# Patient Record
Sex: Female | Born: 1935 | Race: White | Hispanic: No | Smoking: Never smoker
Health system: Southern US, Community
[De-identification: ages and names within clinical notes are randomized; demographics above are authoritative.]

## PROBLEM LIST (undated history)

## (undated) DIAGNOSIS — I10 Essential (primary) hypertension: Secondary | ICD-10-CM

---

## 1963-05-02 HISTORY — PX: ABDOMINAL HYSTERECTOMY: SUR658

## 1993-05-01 HISTORY — PX: COLECTOMY: SHX59

## 2011-05-05 DIAGNOSIS — Z87891 Personal history of nicotine dependence: Secondary | ICD-10-CM | POA: Diagnosis not present

## 2011-05-05 DIAGNOSIS — J441 Chronic obstructive pulmonary disease with (acute) exacerbation: Secondary | ICD-10-CM | POA: Diagnosis not present

## 2011-05-05 DIAGNOSIS — I10 Essential (primary) hypertension: Secondary | ICD-10-CM | POA: Diagnosis not present

## 2011-05-05 DIAGNOSIS — R0602 Shortness of breath: Secondary | ICD-10-CM | POA: Diagnosis not present

## 2011-05-09 DIAGNOSIS — I4949 Other premature depolarization: Secondary | ICD-10-CM | POA: Diagnosis not present

## 2011-05-09 DIAGNOSIS — I10 Essential (primary) hypertension: Secondary | ICD-10-CM | POA: Diagnosis not present

## 2011-05-29 DIAGNOSIS — H40059 Ocular hypertension, unspecified eye: Secondary | ICD-10-CM | POA: Diagnosis not present

## 2011-06-21 DIAGNOSIS — I6789 Other cerebrovascular disease: Secondary | ICD-10-CM | POA: Diagnosis not present

## 2011-06-21 DIAGNOSIS — I4949 Other premature depolarization: Secondary | ICD-10-CM | POA: Diagnosis not present

## 2011-06-21 DIAGNOSIS — I4510 Unspecified right bundle-branch block: Secondary | ICD-10-CM | POA: Diagnosis not present

## 2011-06-21 DIAGNOSIS — J449 Chronic obstructive pulmonary disease, unspecified: Secondary | ICD-10-CM | POA: Diagnosis not present

## 2011-06-21 DIAGNOSIS — I5030 Unspecified diastolic (congestive) heart failure: Secondary | ICD-10-CM | POA: Diagnosis not present

## 2011-06-21 DIAGNOSIS — H409 Unspecified glaucoma: Secondary | ICD-10-CM | POA: Diagnosis not present

## 2011-06-21 DIAGNOSIS — G459 Transient cerebral ischemic attack, unspecified: Secondary | ICD-10-CM | POA: Diagnosis not present

## 2011-06-21 DIAGNOSIS — H538 Other visual disturbances: Secondary | ICD-10-CM | POA: Diagnosis not present

## 2011-06-21 DIAGNOSIS — R002 Palpitations: Secondary | ICD-10-CM | POA: Diagnosis not present

## 2011-06-21 DIAGNOSIS — Z79899 Other long term (current) drug therapy: Secondary | ICD-10-CM | POA: Diagnosis not present

## 2011-06-21 DIAGNOSIS — I509 Heart failure, unspecified: Secondary | ICD-10-CM | POA: Diagnosis not present

## 2011-06-21 DIAGNOSIS — I6529 Occlusion and stenosis of unspecified carotid artery: Secondary | ICD-10-CM | POA: Diagnosis not present

## 2011-06-21 DIAGNOSIS — Z8673 Personal history of transient ischemic attack (TIA), and cerebral infarction without residual deficits: Secondary | ICD-10-CM | POA: Diagnosis not present

## 2011-06-21 DIAGNOSIS — I2510 Atherosclerotic heart disease of native coronary artery without angina pectoris: Secondary | ICD-10-CM | POA: Diagnosis not present

## 2011-06-21 DIAGNOSIS — I10 Essential (primary) hypertension: Secondary | ICD-10-CM | POA: Diagnosis not present

## 2011-06-22 DIAGNOSIS — I059 Rheumatic mitral valve disease, unspecified: Secondary | ICD-10-CM | POA: Diagnosis not present

## 2011-06-22 DIAGNOSIS — I517 Cardiomegaly: Secondary | ICD-10-CM | POA: Diagnosis not present

## 2011-06-22 DIAGNOSIS — I6789 Other cerebrovascular disease: Secondary | ICD-10-CM | POA: Diagnosis not present

## 2011-06-22 DIAGNOSIS — R002 Palpitations: Secondary | ICD-10-CM | POA: Diagnosis not present

## 2011-06-22 DIAGNOSIS — J449 Chronic obstructive pulmonary disease, unspecified: Secondary | ICD-10-CM | POA: Diagnosis not present

## 2011-06-22 DIAGNOSIS — I6529 Occlusion and stenosis of unspecified carotid artery: Secondary | ICD-10-CM | POA: Diagnosis not present

## 2011-07-03 DIAGNOSIS — R002 Palpitations: Secondary | ICD-10-CM | POA: Diagnosis not present

## 2011-07-10 DIAGNOSIS — R112 Nausea with vomiting, unspecified: Secondary | ICD-10-CM | POA: Diagnosis not present

## 2011-07-10 DIAGNOSIS — K920 Hematemesis: Secondary | ICD-10-CM | POA: Diagnosis not present

## 2011-07-10 DIAGNOSIS — R109 Unspecified abdominal pain: Secondary | ICD-10-CM | POA: Diagnosis not present

## 2011-07-10 DIAGNOSIS — R197 Diarrhea, unspecified: Secondary | ICD-10-CM | POA: Diagnosis not present

## 2011-07-12 DIAGNOSIS — R002 Palpitations: Secondary | ICD-10-CM | POA: Diagnosis not present

## 2011-07-12 DIAGNOSIS — I10 Essential (primary) hypertension: Secondary | ICD-10-CM | POA: Diagnosis not present

## 2011-07-14 DIAGNOSIS — K5289 Other specified noninfective gastroenteritis and colitis: Secondary | ICD-10-CM | POA: Diagnosis not present

## 2011-07-27 DIAGNOSIS — I10 Essential (primary) hypertension: Secondary | ICD-10-CM | POA: Diagnosis not present

## 2011-08-08 DIAGNOSIS — R002 Palpitations: Secondary | ICD-10-CM | POA: Diagnosis not present

## 2011-08-08 DIAGNOSIS — I10 Essential (primary) hypertension: Secondary | ICD-10-CM | POA: Diagnosis not present

## 2011-08-14 DIAGNOSIS — R197 Diarrhea, unspecified: Secondary | ICD-10-CM | POA: Diagnosis not present

## 2011-08-14 DIAGNOSIS — E860 Dehydration: Secondary | ICD-10-CM | POA: Diagnosis not present

## 2011-08-14 DIAGNOSIS — R112 Nausea with vomiting, unspecified: Secondary | ICD-10-CM | POA: Diagnosis not present

## 2011-10-16 DIAGNOSIS — I10 Essential (primary) hypertension: Secondary | ICD-10-CM | POA: Diagnosis not present

## 2011-10-16 DIAGNOSIS — E785 Hyperlipidemia, unspecified: Secondary | ICD-10-CM | POA: Diagnosis not present

## 2011-10-16 DIAGNOSIS — J449 Chronic obstructive pulmonary disease, unspecified: Secondary | ICD-10-CM | POA: Diagnosis not present

## 2011-10-16 DIAGNOSIS — J4489 Other specified chronic obstructive pulmonary disease: Secondary | ICD-10-CM | POA: Diagnosis not present

## 2011-10-16 DIAGNOSIS — D649 Anemia, unspecified: Secondary | ICD-10-CM | POA: Diagnosis not present

## 2011-10-16 DIAGNOSIS — I739 Peripheral vascular disease, unspecified: Secondary | ICD-10-CM | POA: Diagnosis not present

## 2011-10-23 DIAGNOSIS — H40059 Ocular hypertension, unspecified eye: Secondary | ICD-10-CM | POA: Diagnosis not present

## 2011-10-25 DIAGNOSIS — Z79899 Other long term (current) drug therapy: Secondary | ICD-10-CM | POA: Diagnosis not present

## 2011-10-25 DIAGNOSIS — R32 Unspecified urinary incontinence: Secondary | ICD-10-CM | POA: Diagnosis not present

## 2011-10-25 DIAGNOSIS — I248 Other forms of acute ischemic heart disease: Secondary | ICD-10-CM | POA: Diagnosis not present

## 2011-10-25 DIAGNOSIS — R0789 Other chest pain: Secondary | ICD-10-CM | POA: Diagnosis not present

## 2011-10-25 DIAGNOSIS — Z9889 Other specified postprocedural states: Secondary | ICD-10-CM | POA: Diagnosis not present

## 2011-10-25 DIAGNOSIS — I10 Essential (primary) hypertension: Secondary | ICD-10-CM | POA: Diagnosis not present

## 2011-10-25 DIAGNOSIS — Z91040 Latex allergy status: Secondary | ICD-10-CM | POA: Diagnosis not present

## 2011-10-25 DIAGNOSIS — I200 Unstable angina: Secondary | ICD-10-CM | POA: Diagnosis not present

## 2011-10-25 DIAGNOSIS — R079 Chest pain, unspecified: Secondary | ICD-10-CM | POA: Diagnosis not present

## 2011-10-25 DIAGNOSIS — Z7982 Long term (current) use of aspirin: Secondary | ICD-10-CM | POA: Diagnosis not present

## 2011-10-25 DIAGNOSIS — E860 Dehydration: Secondary | ICD-10-CM | POA: Diagnosis not present

## 2011-10-25 DIAGNOSIS — R072 Precordial pain: Secondary | ICD-10-CM | POA: Diagnosis not present

## 2011-10-25 DIAGNOSIS — J4489 Other specified chronic obstructive pulmonary disease: Secondary | ICD-10-CM | POA: Diagnosis present

## 2011-10-25 DIAGNOSIS — Z91041 Radiographic dye allergy status: Secondary | ICD-10-CM | POA: Diagnosis not present

## 2011-10-25 DIAGNOSIS — Z833 Family history of diabetes mellitus: Secondary | ICD-10-CM | POA: Diagnosis not present

## 2011-10-25 DIAGNOSIS — I209 Angina pectoris, unspecified: Secondary | ICD-10-CM | POA: Diagnosis not present

## 2011-10-25 DIAGNOSIS — R5381 Other malaise: Secondary | ICD-10-CM | POA: Diagnosis not present

## 2011-10-25 DIAGNOSIS — J449 Chronic obstructive pulmonary disease, unspecified: Secondary | ICD-10-CM | POA: Diagnosis not present

## 2011-10-25 DIAGNOSIS — R0602 Shortness of breath: Secondary | ICD-10-CM | POA: Diagnosis not present

## 2011-10-25 DIAGNOSIS — R110 Nausea: Secondary | ICD-10-CM | POA: Diagnosis not present

## 2011-10-25 DIAGNOSIS — I5032 Chronic diastolic (congestive) heart failure: Secondary | ICD-10-CM | POA: Diagnosis not present

## 2011-10-25 DIAGNOSIS — R002 Palpitations: Secondary | ICD-10-CM | POA: Diagnosis present

## 2011-10-25 DIAGNOSIS — H409 Unspecified glaucoma: Secondary | ICD-10-CM | POA: Diagnosis not present

## 2011-10-25 DIAGNOSIS — Z90710 Acquired absence of both cervix and uterus: Secondary | ICD-10-CM | POA: Diagnosis not present

## 2011-10-25 DIAGNOSIS — I2510 Atherosclerotic heart disease of native coronary artery without angina pectoris: Secondary | ICD-10-CM | POA: Diagnosis not present

## 2011-10-25 DIAGNOSIS — I5030 Unspecified diastolic (congestive) heart failure: Secondary | ICD-10-CM | POA: Diagnosis not present

## 2011-10-25 DIAGNOSIS — Z8673 Personal history of transient ischemic attack (TIA), and cerebral infarction without residual deficits: Secondary | ICD-10-CM | POA: Diagnosis not present

## 2011-10-25 DIAGNOSIS — R9431 Abnormal electrocardiogram [ECG] [EKG]: Secondary | ICD-10-CM | POA: Diagnosis not present

## 2011-10-26 DIAGNOSIS — H409 Unspecified glaucoma: Secondary | ICD-10-CM | POA: Diagnosis not present

## 2011-10-26 DIAGNOSIS — I5032 Chronic diastolic (congestive) heart failure: Secondary | ICD-10-CM | POA: Diagnosis not present

## 2011-10-26 DIAGNOSIS — E860 Dehydration: Secondary | ICD-10-CM | POA: Diagnosis not present

## 2011-10-26 DIAGNOSIS — Z91040 Latex allergy status: Secondary | ICD-10-CM | POA: Diagnosis not present

## 2011-10-26 DIAGNOSIS — Z9889 Other specified postprocedural states: Secondary | ICD-10-CM | POA: Diagnosis not present

## 2011-10-26 DIAGNOSIS — I5030 Unspecified diastolic (congestive) heart failure: Secondary | ICD-10-CM | POA: Diagnosis not present

## 2011-10-26 DIAGNOSIS — Z79899 Other long term (current) drug therapy: Secondary | ICD-10-CM | POA: Diagnosis not present

## 2011-10-26 DIAGNOSIS — R5381 Other malaise: Secondary | ICD-10-CM | POA: Diagnosis not present

## 2011-10-26 DIAGNOSIS — I2510 Atherosclerotic heart disease of native coronary artery without angina pectoris: Secondary | ICD-10-CM | POA: Diagnosis not present

## 2011-10-26 DIAGNOSIS — Z90710 Acquired absence of both cervix and uterus: Secondary | ICD-10-CM | POA: Diagnosis not present

## 2011-10-26 DIAGNOSIS — I10 Essential (primary) hypertension: Secondary | ICD-10-CM | POA: Diagnosis not present

## 2011-10-26 DIAGNOSIS — I200 Unstable angina: Secondary | ICD-10-CM | POA: Diagnosis not present

## 2011-10-26 DIAGNOSIS — I209 Angina pectoris, unspecified: Secondary | ICD-10-CM | POA: Diagnosis not present

## 2011-10-26 DIAGNOSIS — Z8673 Personal history of transient ischemic attack (TIA), and cerebral infarction without residual deficits: Secondary | ICD-10-CM | POA: Diagnosis not present

## 2011-10-26 DIAGNOSIS — R5383 Other fatigue: Secondary | ICD-10-CM | POA: Diagnosis not present

## 2011-10-26 DIAGNOSIS — Z7982 Long term (current) use of aspirin: Secondary | ICD-10-CM | POA: Diagnosis not present

## 2011-10-26 DIAGNOSIS — J449 Chronic obstructive pulmonary disease, unspecified: Secondary | ICD-10-CM | POA: Diagnosis not present

## 2011-10-26 DIAGNOSIS — R32 Unspecified urinary incontinence: Secondary | ICD-10-CM | POA: Diagnosis not present

## 2011-10-26 DIAGNOSIS — Z833 Family history of diabetes mellitus: Secondary | ICD-10-CM | POA: Diagnosis not present

## 2011-10-26 DIAGNOSIS — R9431 Abnormal electrocardiogram [ECG] [EKG]: Secondary | ICD-10-CM | POA: Diagnosis not present

## 2011-10-26 DIAGNOSIS — R0789 Other chest pain: Secondary | ICD-10-CM | POA: Diagnosis not present

## 2011-10-27 DIAGNOSIS — J449 Chronic obstructive pulmonary disease, unspecified: Secondary | ICD-10-CM | POA: Diagnosis not present

## 2011-10-27 DIAGNOSIS — I200 Unstable angina: Secondary | ICD-10-CM | POA: Diagnosis not present

## 2011-10-27 DIAGNOSIS — R0789 Other chest pain: Secondary | ICD-10-CM | POA: Diagnosis not present

## 2011-10-27 DIAGNOSIS — R5381 Other malaise: Secondary | ICD-10-CM | POA: Diagnosis not present

## 2011-10-27 DIAGNOSIS — Z9861 Coronary angioplasty status: Secondary | ICD-10-CM | POA: Diagnosis not present

## 2011-10-27 DIAGNOSIS — I209 Angina pectoris, unspecified: Secondary | ICD-10-CM | POA: Diagnosis not present

## 2011-10-27 DIAGNOSIS — I10 Essential (primary) hypertension: Secondary | ICD-10-CM | POA: Diagnosis not present

## 2011-10-27 DIAGNOSIS — I2510 Atherosclerotic heart disease of native coronary artery without angina pectoris: Secondary | ICD-10-CM | POA: Diagnosis not present

## 2011-11-04 DIAGNOSIS — Z79899 Other long term (current) drug therapy: Secondary | ICD-10-CM | POA: Diagnosis not present

## 2011-11-04 DIAGNOSIS — S79919A Unspecified injury of unspecified hip, initial encounter: Secondary | ICD-10-CM | POA: Diagnosis not present

## 2011-11-04 DIAGNOSIS — Z01810 Encounter for preprocedural cardiovascular examination: Secondary | ICD-10-CM | POA: Diagnosis not present

## 2011-11-04 DIAGNOSIS — R748 Abnormal levels of other serum enzymes: Secondary | ICD-10-CM | POA: Diagnosis present

## 2011-11-04 DIAGNOSIS — K219 Gastro-esophageal reflux disease without esophagitis: Secondary | ICD-10-CM | POA: Diagnosis present

## 2011-11-04 DIAGNOSIS — M818 Other osteoporosis without current pathological fracture: Secondary | ICD-10-CM | POA: Diagnosis present

## 2011-11-04 DIAGNOSIS — S72033A Displaced midcervical fracture of unspecified femur, initial encounter for closed fracture: Secondary | ICD-10-CM | POA: Diagnosis not present

## 2011-11-04 DIAGNOSIS — I110 Hypertensive heart disease with heart failure: Secondary | ICD-10-CM | POA: Diagnosis not present

## 2011-11-04 DIAGNOSIS — M25559 Pain in unspecified hip: Secondary | ICD-10-CM | POA: Diagnosis not present

## 2011-11-04 DIAGNOSIS — I4510 Unspecified right bundle-branch block: Secondary | ICD-10-CM | POA: Diagnosis not present

## 2011-11-04 DIAGNOSIS — M259 Joint disorder, unspecified: Secondary | ICD-10-CM | POA: Diagnosis not present

## 2011-11-04 DIAGNOSIS — Z87891 Personal history of nicotine dependence: Secondary | ICD-10-CM | POA: Diagnosis not present

## 2011-11-04 DIAGNOSIS — M2550 Pain in unspecified joint: Secondary | ICD-10-CM | POA: Diagnosis not present

## 2011-11-04 DIAGNOSIS — S72009A Fracture of unspecified part of neck of unspecified femur, initial encounter for closed fracture: Secondary | ICD-10-CM | POA: Diagnosis not present

## 2011-11-04 DIAGNOSIS — H409 Unspecified glaucoma: Secondary | ICD-10-CM | POA: Diagnosis present

## 2011-11-04 DIAGNOSIS — S72043A Displaced fracture of base of neck of unspecified femur, initial encounter for closed fracture: Secondary | ICD-10-CM | POA: Diagnosis not present

## 2011-11-04 DIAGNOSIS — I498 Other specified cardiac arrhythmias: Secondary | ICD-10-CM | POA: Diagnosis not present

## 2011-11-04 DIAGNOSIS — S79929A Unspecified injury of unspecified thigh, initial encounter: Secondary | ICD-10-CM | POA: Diagnosis not present

## 2011-11-04 DIAGNOSIS — Z85828 Personal history of other malignant neoplasm of skin: Secondary | ICD-10-CM | POA: Diagnosis not present

## 2011-11-04 DIAGNOSIS — I739 Peripheral vascular disease, unspecified: Secondary | ICD-10-CM | POA: Diagnosis present

## 2011-11-04 DIAGNOSIS — I5030 Unspecified diastolic (congestive) heart failure: Secondary | ICD-10-CM | POA: Diagnosis not present

## 2011-11-04 DIAGNOSIS — T148XXA Other injury of unspecified body region, initial encounter: Secondary | ICD-10-CM | POA: Diagnosis not present

## 2011-11-04 DIAGNOSIS — M810 Age-related osteoporosis without current pathological fracture: Secondary | ICD-10-CM | POA: Diagnosis not present

## 2011-11-04 DIAGNOSIS — I6789 Other cerebrovascular disease: Secondary | ICD-10-CM | POA: Diagnosis not present

## 2011-11-04 DIAGNOSIS — J9610 Chronic respiratory failure, unspecified whether with hypoxia or hypercapnia: Secondary | ICD-10-CM | POA: Diagnosis not present

## 2011-11-04 DIAGNOSIS — I10 Essential (primary) hypertension: Secondary | ICD-10-CM | POA: Diagnosis not present

## 2011-11-04 DIAGNOSIS — R296 Repeated falls: Secondary | ICD-10-CM | POA: Diagnosis not present

## 2011-11-04 DIAGNOSIS — D649 Anemia, unspecified: Secondary | ICD-10-CM | POA: Diagnosis not present

## 2011-11-04 DIAGNOSIS — S7290XA Unspecified fracture of unspecified femur, initial encounter for closed fracture: Secondary | ICD-10-CM | POA: Diagnosis not present

## 2011-11-04 DIAGNOSIS — I959 Hypotension, unspecified: Secondary | ICD-10-CM | POA: Diagnosis not present

## 2011-11-04 DIAGNOSIS — I2510 Atherosclerotic heart disease of native coronary artery without angina pectoris: Secondary | ICD-10-CM | POA: Diagnosis not present

## 2011-11-04 DIAGNOSIS — I509 Heart failure, unspecified: Secondary | ICD-10-CM | POA: Diagnosis not present

## 2011-11-04 DIAGNOSIS — Z8673 Personal history of transient ischemic attack (TIA), and cerebral infarction without residual deficits: Secondary | ICD-10-CM | POA: Diagnosis not present

## 2011-11-04 DIAGNOSIS — S72143A Displaced intertrochanteric fracture of unspecified femur, initial encounter for closed fracture: Secondary | ICD-10-CM | POA: Diagnosis not present

## 2011-11-04 DIAGNOSIS — Z85038 Personal history of other malignant neoplasm of large intestine: Secondary | ICD-10-CM | POA: Diagnosis not present

## 2011-11-04 DIAGNOSIS — E785 Hyperlipidemia, unspecified: Secondary | ICD-10-CM | POA: Diagnosis not present

## 2011-11-04 DIAGNOSIS — S72009D Fracture of unspecified part of neck of unspecified femur, subsequent encounter for closed fracture with routine healing: Secondary | ICD-10-CM | POA: Diagnosis not present

## 2011-11-04 DIAGNOSIS — J449 Chronic obstructive pulmonary disease, unspecified: Secondary | ICD-10-CM | POA: Diagnosis not present

## 2011-11-04 DIAGNOSIS — Z87898 Personal history of other specified conditions: Secondary | ICD-10-CM | POA: Diagnosis not present

## 2011-11-04 DIAGNOSIS — Z7902 Long term (current) use of antithrombotics/antiplatelets: Secondary | ICD-10-CM | POA: Diagnosis not present

## 2011-11-04 DIAGNOSIS — R269 Unspecified abnormalities of gait and mobility: Secondary | ICD-10-CM | POA: Diagnosis not present

## 2011-11-04 DIAGNOSIS — J4489 Other specified chronic obstructive pulmonary disease: Secondary | ICD-10-CM | POA: Diagnosis not present

## 2011-11-08 DIAGNOSIS — R002 Palpitations: Secondary | ICD-10-CM | POA: Diagnosis not present

## 2011-11-08 DIAGNOSIS — R269 Unspecified abnormalities of gait and mobility: Secondary | ICD-10-CM | POA: Diagnosis not present

## 2011-11-08 DIAGNOSIS — S72033A Displaced midcervical fracture of unspecified femur, initial encounter for closed fracture: Secondary | ICD-10-CM | POA: Diagnosis not present

## 2011-11-08 DIAGNOSIS — I5030 Unspecified diastolic (congestive) heart failure: Secondary | ICD-10-CM | POA: Diagnosis not present

## 2011-11-08 DIAGNOSIS — I2510 Atherosclerotic heart disease of native coronary artery without angina pectoris: Secondary | ICD-10-CM | POA: Diagnosis not present

## 2011-11-08 DIAGNOSIS — E785 Hyperlipidemia, unspecified: Secondary | ICD-10-CM | POA: Diagnosis not present

## 2011-11-08 DIAGNOSIS — M2550 Pain in unspecified joint: Secondary | ICD-10-CM | POA: Diagnosis not present

## 2011-11-08 DIAGNOSIS — D649 Anemia, unspecified: Secondary | ICD-10-CM | POA: Diagnosis not present

## 2011-11-08 DIAGNOSIS — S72009D Fracture of unspecified part of neck of unspecified femur, subsequent encounter for closed fracture with routine healing: Secondary | ICD-10-CM | POA: Diagnosis not present

## 2011-11-08 DIAGNOSIS — S7290XA Unspecified fracture of unspecified femur, initial encounter for closed fracture: Secondary | ICD-10-CM | POA: Diagnosis not present

## 2011-11-08 DIAGNOSIS — M810 Age-related osteoporosis without current pathological fracture: Secondary | ICD-10-CM | POA: Diagnosis not present

## 2011-11-08 DIAGNOSIS — I10 Essential (primary) hypertension: Secondary | ICD-10-CM | POA: Diagnosis not present

## 2011-11-08 DIAGNOSIS — I498 Other specified cardiac arrhythmias: Secondary | ICD-10-CM | POA: Diagnosis not present

## 2011-11-08 DIAGNOSIS — I6789 Other cerebrovascular disease: Secondary | ICD-10-CM | POA: Diagnosis not present

## 2011-11-08 DIAGNOSIS — I4510 Unspecified right bundle-branch block: Secondary | ICD-10-CM | POA: Diagnosis not present

## 2011-11-08 DIAGNOSIS — J449 Chronic obstructive pulmonary disease, unspecified: Secondary | ICD-10-CM | POA: Diagnosis not present

## 2011-11-08 DIAGNOSIS — S72009A Fracture of unspecified part of neck of unspecified femur, initial encounter for closed fracture: Secondary | ICD-10-CM | POA: Diagnosis not present

## 2011-11-08 DIAGNOSIS — M25559 Pain in unspecified hip: Secondary | ICD-10-CM | POA: Diagnosis not present

## 2011-11-15 DIAGNOSIS — S72009A Fracture of unspecified part of neck of unspecified femur, initial encounter for closed fracture: Secondary | ICD-10-CM | POA: Diagnosis not present

## 2011-11-15 DIAGNOSIS — I10 Essential (primary) hypertension: Secondary | ICD-10-CM | POA: Diagnosis not present

## 2011-11-15 DIAGNOSIS — I5030 Unspecified diastolic (congestive) heart failure: Secondary | ICD-10-CM | POA: Diagnosis not present

## 2011-11-15 DIAGNOSIS — I2510 Atherosclerotic heart disease of native coronary artery without angina pectoris: Secondary | ICD-10-CM | POA: Diagnosis not present

## 2011-11-23 DIAGNOSIS — R002 Palpitations: Secondary | ICD-10-CM | POA: Diagnosis not present

## 2011-11-23 DIAGNOSIS — I10 Essential (primary) hypertension: Secondary | ICD-10-CM | POA: Diagnosis not present

## 2011-12-11 DIAGNOSIS — I10 Essential (primary) hypertension: Secondary | ICD-10-CM | POA: Diagnosis not present

## 2011-12-11 DIAGNOSIS — R269 Unspecified abnormalities of gait and mobility: Secondary | ICD-10-CM | POA: Diagnosis not present

## 2011-12-11 DIAGNOSIS — Z5189 Encounter for other specified aftercare: Secondary | ICD-10-CM | POA: Diagnosis not present

## 2011-12-15 DIAGNOSIS — R269 Unspecified abnormalities of gait and mobility: Secondary | ICD-10-CM | POA: Diagnosis not present

## 2011-12-15 DIAGNOSIS — I10 Essential (primary) hypertension: Secondary | ICD-10-CM | POA: Diagnosis not present

## 2011-12-15 DIAGNOSIS — Z5189 Encounter for other specified aftercare: Secondary | ICD-10-CM | POA: Diagnosis not present

## 2011-12-18 DIAGNOSIS — R269 Unspecified abnormalities of gait and mobility: Secondary | ICD-10-CM | POA: Diagnosis not present

## 2011-12-18 DIAGNOSIS — I10 Essential (primary) hypertension: Secondary | ICD-10-CM | POA: Diagnosis not present

## 2011-12-18 DIAGNOSIS — Z5189 Encounter for other specified aftercare: Secondary | ICD-10-CM | POA: Diagnosis not present

## 2011-12-20 DIAGNOSIS — I10 Essential (primary) hypertension: Secondary | ICD-10-CM | POA: Diagnosis not present

## 2011-12-20 DIAGNOSIS — R269 Unspecified abnormalities of gait and mobility: Secondary | ICD-10-CM | POA: Diagnosis not present

## 2011-12-20 DIAGNOSIS — Z5189 Encounter for other specified aftercare: Secondary | ICD-10-CM | POA: Diagnosis not present

## 2011-12-21 DIAGNOSIS — R269 Unspecified abnormalities of gait and mobility: Secondary | ICD-10-CM | POA: Diagnosis not present

## 2011-12-21 DIAGNOSIS — Z5189 Encounter for other specified aftercare: Secondary | ICD-10-CM | POA: Diagnosis not present

## 2011-12-21 DIAGNOSIS — I10 Essential (primary) hypertension: Secondary | ICD-10-CM | POA: Diagnosis not present

## 2011-12-22 DIAGNOSIS — S72009A Fracture of unspecified part of neck of unspecified femur, initial encounter for closed fracture: Secondary | ICD-10-CM | POA: Diagnosis not present

## 2011-12-26 DIAGNOSIS — I10 Essential (primary) hypertension: Secondary | ICD-10-CM | POA: Diagnosis not present

## 2011-12-26 DIAGNOSIS — R269 Unspecified abnormalities of gait and mobility: Secondary | ICD-10-CM | POA: Diagnosis not present

## 2011-12-26 DIAGNOSIS — Z5189 Encounter for other specified aftercare: Secondary | ICD-10-CM | POA: Diagnosis not present

## 2011-12-27 DIAGNOSIS — I10 Essential (primary) hypertension: Secondary | ICD-10-CM | POA: Diagnosis not present

## 2011-12-27 DIAGNOSIS — R269 Unspecified abnormalities of gait and mobility: Secondary | ICD-10-CM | POA: Diagnosis not present

## 2011-12-27 DIAGNOSIS — Z5189 Encounter for other specified aftercare: Secondary | ICD-10-CM | POA: Diagnosis not present

## 2011-12-28 DIAGNOSIS — R269 Unspecified abnormalities of gait and mobility: Secondary | ICD-10-CM | POA: Diagnosis not present

## 2011-12-28 DIAGNOSIS — I10 Essential (primary) hypertension: Secondary | ICD-10-CM | POA: Diagnosis not present

## 2011-12-28 DIAGNOSIS — Z5189 Encounter for other specified aftercare: Secondary | ICD-10-CM | POA: Diagnosis not present

## 2011-12-29 DIAGNOSIS — S72043A Displaced fracture of base of neck of unspecified femur, initial encounter for closed fracture: Secondary | ICD-10-CM | POA: Diagnosis not present

## 2012-01-17 DIAGNOSIS — H409 Unspecified glaucoma: Secondary | ICD-10-CM | POA: Diagnosis present

## 2012-01-17 DIAGNOSIS — Z7982 Long term (current) use of aspirin: Secondary | ICD-10-CM | POA: Diagnosis not present

## 2012-01-17 DIAGNOSIS — K560 Paralytic ileus: Secondary | ICD-10-CM | POA: Diagnosis not present

## 2012-01-17 DIAGNOSIS — K55059 Acute (reversible) ischemia of intestine, part and extent unspecified: Secondary | ICD-10-CM | POA: Diagnosis not present

## 2012-01-17 DIAGNOSIS — J4489 Other specified chronic obstructive pulmonary disease: Secondary | ICD-10-CM | POA: Diagnosis not present

## 2012-01-17 DIAGNOSIS — I501 Left ventricular failure: Secondary | ICD-10-CM | POA: Diagnosis not present

## 2012-01-17 DIAGNOSIS — K551 Chronic vascular disorders of intestine: Secondary | ICD-10-CM | POA: Diagnosis not present

## 2012-01-17 DIAGNOSIS — E785 Hyperlipidemia, unspecified: Secondary | ICD-10-CM | POA: Diagnosis present

## 2012-01-17 DIAGNOSIS — R109 Unspecified abdominal pain: Secondary | ICD-10-CM | POA: Diagnosis not present

## 2012-01-17 DIAGNOSIS — K565 Intestinal adhesions [bands], unspecified as to partial versus complete obstruction: Secondary | ICD-10-CM | POA: Diagnosis not present

## 2012-01-17 DIAGNOSIS — D649 Anemia, unspecified: Secondary | ICD-10-CM | POA: Diagnosis not present

## 2012-01-17 DIAGNOSIS — E876 Hypokalemia: Secondary | ICD-10-CM | POA: Diagnosis not present

## 2012-01-17 DIAGNOSIS — Z87898 Personal history of other specified conditions: Secondary | ICD-10-CM | POA: Diagnosis not present

## 2012-01-17 DIAGNOSIS — I5032 Chronic diastolic (congestive) heart failure: Secondary | ICD-10-CM | POA: Diagnosis not present

## 2012-01-17 DIAGNOSIS — M810 Age-related osteoporosis without current pathological fracture: Secondary | ICD-10-CM | POA: Diagnosis present

## 2012-01-17 DIAGNOSIS — K929 Disease of digestive system, unspecified: Secondary | ICD-10-CM | POA: Diagnosis not present

## 2012-01-17 DIAGNOSIS — J449 Chronic obstructive pulmonary disease, unspecified: Secondary | ICD-10-CM | POA: Diagnosis not present

## 2012-01-17 DIAGNOSIS — N289 Disorder of kidney and ureter, unspecified: Secondary | ICD-10-CM | POA: Diagnosis not present

## 2012-01-17 DIAGNOSIS — K430 Incisional hernia with obstruction, without gangrene: Secondary | ICD-10-CM | POA: Diagnosis not present

## 2012-01-17 DIAGNOSIS — Z9861 Coronary angioplasty status: Secondary | ICD-10-CM | POA: Diagnosis not present

## 2012-01-17 DIAGNOSIS — I10 Essential (primary) hypertension: Secondary | ICD-10-CM | POA: Diagnosis not present

## 2012-01-17 DIAGNOSIS — K559 Vascular disorder of intestine, unspecified: Secondary | ICD-10-CM | POA: Diagnosis not present

## 2012-01-17 DIAGNOSIS — K432 Incisional hernia without obstruction or gangrene: Secondary | ICD-10-CM | POA: Diagnosis not present

## 2012-01-17 DIAGNOSIS — I5030 Unspecified diastolic (congestive) heart failure: Secondary | ICD-10-CM | POA: Diagnosis not present

## 2012-01-17 DIAGNOSIS — M1990 Unspecified osteoarthritis, unspecified site: Secondary | ICD-10-CM | POA: Diagnosis present

## 2012-01-17 DIAGNOSIS — Z87891 Personal history of nicotine dependence: Secondary | ICD-10-CM | POA: Diagnosis not present

## 2012-01-17 DIAGNOSIS — I509 Heart failure, unspecified: Secondary | ICD-10-CM | POA: Diagnosis present

## 2012-01-17 DIAGNOSIS — I2510 Atherosclerotic heart disease of native coronary artery without angina pectoris: Secondary | ICD-10-CM | POA: Diagnosis not present

## 2012-01-17 DIAGNOSIS — K56609 Unspecified intestinal obstruction, unspecified as to partial versus complete obstruction: Secondary | ICD-10-CM | POA: Diagnosis not present

## 2012-01-17 DIAGNOSIS — E860 Dehydration: Secondary | ICD-10-CM | POA: Diagnosis not present

## 2012-01-17 DIAGNOSIS — N179 Acute kidney failure, unspecified: Secondary | ICD-10-CM | POA: Diagnosis not present

## 2012-01-17 DIAGNOSIS — Z8673 Personal history of transient ischemic attack (TIA), and cerebral infarction without residual deficits: Secondary | ICD-10-CM | POA: Diagnosis not present

## 2012-01-17 DIAGNOSIS — Z85038 Personal history of other malignant neoplasm of large intestine: Secondary | ICD-10-CM | POA: Diagnosis not present

## 2012-01-26 DIAGNOSIS — D510 Vitamin B12 deficiency anemia due to intrinsic factor deficiency: Secondary | ICD-10-CM | POA: Diagnosis not present

## 2012-01-26 DIAGNOSIS — J441 Chronic obstructive pulmonary disease with (acute) exacerbation: Secondary | ICD-10-CM | POA: Diagnosis not present

## 2012-01-26 DIAGNOSIS — I10 Essential (primary) hypertension: Secondary | ICD-10-CM | POA: Diagnosis not present

## 2012-01-27 DIAGNOSIS — D510 Vitamin B12 deficiency anemia due to intrinsic factor deficiency: Secondary | ICD-10-CM | POA: Diagnosis not present

## 2012-01-27 DIAGNOSIS — I10 Essential (primary) hypertension: Secondary | ICD-10-CM | POA: Diagnosis not present

## 2012-01-27 DIAGNOSIS — J441 Chronic obstructive pulmonary disease with (acute) exacerbation: Secondary | ICD-10-CM | POA: Diagnosis not present

## 2012-01-28 DIAGNOSIS — Z9049 Acquired absence of other specified parts of digestive tract: Secondary | ICD-10-CM | POA: Diagnosis not present

## 2012-01-28 DIAGNOSIS — K449 Diaphragmatic hernia without obstruction or gangrene: Secondary | ICD-10-CM | POA: Diagnosis not present

## 2012-01-28 DIAGNOSIS — Z7982 Long term (current) use of aspirin: Secondary | ICD-10-CM | POA: Diagnosis not present

## 2012-01-28 DIAGNOSIS — D500 Iron deficiency anemia secondary to blood loss (chronic): Secondary | ICD-10-CM | POA: Diagnosis not present

## 2012-01-28 DIAGNOSIS — M810 Age-related osteoporosis without current pathological fracture: Secondary | ICD-10-CM | POA: Diagnosis not present

## 2012-01-28 DIAGNOSIS — I509 Heart failure, unspecified: Secondary | ICD-10-CM | POA: Diagnosis not present

## 2012-01-28 DIAGNOSIS — Z23 Encounter for immunization: Secondary | ICD-10-CM | POA: Diagnosis not present

## 2012-01-28 DIAGNOSIS — K2960 Other gastritis without bleeding: Secondary | ICD-10-CM | POA: Diagnosis present

## 2012-01-28 DIAGNOSIS — Z8719 Personal history of other diseases of the digestive system: Secondary | ICD-10-CM | POA: Diagnosis not present

## 2012-01-28 DIAGNOSIS — R195 Other fecal abnormalities: Secondary | ICD-10-CM | POA: Diagnosis not present

## 2012-01-28 DIAGNOSIS — K922 Gastrointestinal hemorrhage, unspecified: Secondary | ICD-10-CM | POA: Diagnosis not present

## 2012-01-28 DIAGNOSIS — J4489 Other specified chronic obstructive pulmonary disease: Secondary | ICD-10-CM | POA: Diagnosis not present

## 2012-01-28 DIAGNOSIS — I2510 Atherosclerotic heart disease of native coronary artery without angina pectoris: Secondary | ICD-10-CM | POA: Diagnosis not present

## 2012-01-28 DIAGNOSIS — K56609 Unspecified intestinal obstruction, unspecified as to partial versus complete obstruction: Secondary | ICD-10-CM | POA: Diagnosis not present

## 2012-01-28 DIAGNOSIS — K2970 Gastritis, unspecified, without bleeding: Secondary | ICD-10-CM | POA: Diagnosis not present

## 2012-01-28 DIAGNOSIS — R9431 Abnormal electrocardiogram [ECG] [EKG]: Secondary | ICD-10-CM | POA: Diagnosis not present

## 2012-01-28 DIAGNOSIS — Z7902 Long term (current) use of antithrombotics/antiplatelets: Secondary | ICD-10-CM | POA: Diagnosis not present

## 2012-01-28 DIAGNOSIS — Z85038 Personal history of other malignant neoplasm of large intestine: Secondary | ICD-10-CM | POA: Diagnosis not present

## 2012-01-28 DIAGNOSIS — Z9861 Coronary angioplasty status: Secondary | ICD-10-CM | POA: Diagnosis not present

## 2012-01-28 DIAGNOSIS — D62 Acute posthemorrhagic anemia: Secondary | ICD-10-CM | POA: Diagnosis not present

## 2012-01-28 DIAGNOSIS — I501 Left ventricular failure: Secondary | ICD-10-CM | POA: Diagnosis not present

## 2012-01-28 DIAGNOSIS — Z87891 Personal history of nicotine dependence: Secondary | ICD-10-CM | POA: Diagnosis not present

## 2012-01-28 DIAGNOSIS — K921 Melena: Secondary | ICD-10-CM | POA: Diagnosis not present

## 2012-01-28 DIAGNOSIS — M818 Other osteoporosis without current pathological fracture: Secondary | ICD-10-CM | POA: Diagnosis not present

## 2012-01-28 DIAGNOSIS — I5030 Unspecified diastolic (congestive) heart failure: Secondary | ICD-10-CM | POA: Diagnosis not present

## 2012-01-28 DIAGNOSIS — R1084 Generalized abdominal pain: Secondary | ICD-10-CM | POA: Diagnosis not present

## 2012-01-28 DIAGNOSIS — Z8673 Personal history of transient ischemic attack (TIA), and cerebral infarction without residual deficits: Secondary | ICD-10-CM | POA: Diagnosis not present

## 2012-01-28 DIAGNOSIS — K5731 Diverticulosis of large intestine without perforation or abscess with bleeding: Secondary | ICD-10-CM | POA: Diagnosis not present

## 2012-01-28 DIAGNOSIS — J449 Chronic obstructive pulmonary disease, unspecified: Secondary | ICD-10-CM | POA: Diagnosis not present

## 2012-01-28 DIAGNOSIS — Z79899 Other long term (current) drug therapy: Secondary | ICD-10-CM | POA: Diagnosis not present

## 2012-02-03 DIAGNOSIS — D510 Vitamin B12 deficiency anemia due to intrinsic factor deficiency: Secondary | ICD-10-CM | POA: Diagnosis not present

## 2012-02-03 DIAGNOSIS — I10 Essential (primary) hypertension: Secondary | ICD-10-CM | POA: Diagnosis not present

## 2012-02-03 DIAGNOSIS — J441 Chronic obstructive pulmonary disease with (acute) exacerbation: Secondary | ICD-10-CM | POA: Diagnosis not present

## 2012-02-04 DIAGNOSIS — R5383 Other fatigue: Secondary | ICD-10-CM | POA: Diagnosis not present

## 2012-02-04 DIAGNOSIS — R0602 Shortness of breath: Secondary | ICD-10-CM | POA: Diagnosis not present

## 2012-02-04 DIAGNOSIS — R05 Cough: Secondary | ICD-10-CM | POA: Diagnosis not present

## 2012-02-04 DIAGNOSIS — E7800 Pure hypercholesterolemia, unspecified: Secondary | ICD-10-CM | POA: Diagnosis not present

## 2012-02-04 DIAGNOSIS — J449 Chronic obstructive pulmonary disease, unspecified: Secondary | ICD-10-CM | POA: Diagnosis not present

## 2012-02-04 DIAGNOSIS — I252 Old myocardial infarction: Secondary | ICD-10-CM | POA: Diagnosis not present

## 2012-02-04 DIAGNOSIS — R069 Unspecified abnormalities of breathing: Secondary | ICD-10-CM | POA: Diagnosis not present

## 2012-02-04 DIAGNOSIS — Z79899 Other long term (current) drug therapy: Secondary | ICD-10-CM | POA: Diagnosis not present

## 2012-02-04 DIAGNOSIS — R5381 Other malaise: Secondary | ICD-10-CM | POA: Diagnosis not present

## 2012-02-04 DIAGNOSIS — I10 Essential (primary) hypertension: Secondary | ICD-10-CM | POA: Diagnosis not present

## 2012-02-04 DIAGNOSIS — Z8673 Personal history of transient ischemic attack (TIA), and cerebral infarction without residual deficits: Secondary | ICD-10-CM | POA: Diagnosis not present

## 2012-02-05 DIAGNOSIS — D510 Vitamin B12 deficiency anemia due to intrinsic factor deficiency: Secondary | ICD-10-CM | POA: Diagnosis not present

## 2012-02-05 DIAGNOSIS — I10 Essential (primary) hypertension: Secondary | ICD-10-CM | POA: Diagnosis not present

## 2012-02-05 DIAGNOSIS — J441 Chronic obstructive pulmonary disease with (acute) exacerbation: Secondary | ICD-10-CM | POA: Diagnosis not present

## 2012-02-07 DIAGNOSIS — I10 Essential (primary) hypertension: Secondary | ICD-10-CM | POA: Diagnosis not present

## 2012-02-07 DIAGNOSIS — D510 Vitamin B12 deficiency anemia due to intrinsic factor deficiency: Secondary | ICD-10-CM | POA: Diagnosis not present

## 2012-02-07 DIAGNOSIS — J441 Chronic obstructive pulmonary disease with (acute) exacerbation: Secondary | ICD-10-CM | POA: Diagnosis not present

## 2012-02-09 DIAGNOSIS — S72043B Displaced fracture of base of neck of unspecified femur, initial encounter for open fracture type I or II: Secondary | ICD-10-CM | POA: Diagnosis not present

## 2012-02-11 DIAGNOSIS — I10 Essential (primary) hypertension: Secondary | ICD-10-CM | POA: Diagnosis not present

## 2012-02-11 DIAGNOSIS — D510 Vitamin B12 deficiency anemia due to intrinsic factor deficiency: Secondary | ICD-10-CM | POA: Diagnosis not present

## 2012-02-11 DIAGNOSIS — J441 Chronic obstructive pulmonary disease with (acute) exacerbation: Secondary | ICD-10-CM | POA: Diagnosis not present

## 2012-02-12 DIAGNOSIS — D649 Anemia, unspecified: Secondary | ICD-10-CM | POA: Diagnosis not present

## 2012-02-12 DIAGNOSIS — R059 Cough, unspecified: Secondary | ICD-10-CM | POA: Diagnosis not present

## 2012-02-12 DIAGNOSIS — R05 Cough: Secondary | ICD-10-CM | POA: Diagnosis not present

## 2012-02-13 DIAGNOSIS — J441 Chronic obstructive pulmonary disease with (acute) exacerbation: Secondary | ICD-10-CM | POA: Diagnosis not present

## 2012-02-13 DIAGNOSIS — D510 Vitamin B12 deficiency anemia due to intrinsic factor deficiency: Secondary | ICD-10-CM | POA: Diagnosis not present

## 2012-02-13 DIAGNOSIS — I10 Essential (primary) hypertension: Secondary | ICD-10-CM | POA: Diagnosis not present

## 2012-02-14 DIAGNOSIS — D510 Vitamin B12 deficiency anemia due to intrinsic factor deficiency: Secondary | ICD-10-CM | POA: Diagnosis not present

## 2012-02-14 DIAGNOSIS — I10 Essential (primary) hypertension: Secondary | ICD-10-CM | POA: Diagnosis not present

## 2012-02-14 DIAGNOSIS — J441 Chronic obstructive pulmonary disease with (acute) exacerbation: Secondary | ICD-10-CM | POA: Diagnosis not present

## 2012-02-15 DIAGNOSIS — I10 Essential (primary) hypertension: Secondary | ICD-10-CM | POA: Diagnosis not present

## 2012-02-15 DIAGNOSIS — Z9889 Other specified postprocedural states: Secondary | ICD-10-CM | POA: Diagnosis not present

## 2012-02-15 DIAGNOSIS — J441 Chronic obstructive pulmonary disease with (acute) exacerbation: Secondary | ICD-10-CM | POA: Diagnosis not present

## 2012-02-15 DIAGNOSIS — K922 Gastrointestinal hemorrhage, unspecified: Secondary | ICD-10-CM | POA: Diagnosis not present

## 2012-02-15 DIAGNOSIS — D510 Vitamin B12 deficiency anemia due to intrinsic factor deficiency: Secondary | ICD-10-CM | POA: Diagnosis not present

## 2012-02-15 DIAGNOSIS — R002 Palpitations: Secondary | ICD-10-CM | POA: Diagnosis not present

## 2012-02-15 DIAGNOSIS — I2510 Atherosclerotic heart disease of native coronary artery without angina pectoris: Secondary | ICD-10-CM | POA: Diagnosis not present

## 2012-02-20 DIAGNOSIS — I10 Essential (primary) hypertension: Secondary | ICD-10-CM | POA: Diagnosis not present

## 2012-02-20 DIAGNOSIS — J441 Chronic obstructive pulmonary disease with (acute) exacerbation: Secondary | ICD-10-CM | POA: Diagnosis not present

## 2012-02-20 DIAGNOSIS — D510 Vitamin B12 deficiency anemia due to intrinsic factor deficiency: Secondary | ICD-10-CM | POA: Diagnosis not present

## 2012-02-22 DIAGNOSIS — J441 Chronic obstructive pulmonary disease with (acute) exacerbation: Secondary | ICD-10-CM | POA: Diagnosis not present

## 2012-02-22 DIAGNOSIS — D510 Vitamin B12 deficiency anemia due to intrinsic factor deficiency: Secondary | ICD-10-CM | POA: Diagnosis not present

## 2012-02-22 DIAGNOSIS — I10 Essential (primary) hypertension: Secondary | ICD-10-CM | POA: Diagnosis not present

## 2012-02-26 DIAGNOSIS — D649 Anemia, unspecified: Secondary | ICD-10-CM | POA: Diagnosis not present

## 2012-02-27 DIAGNOSIS — J441 Chronic obstructive pulmonary disease with (acute) exacerbation: Secondary | ICD-10-CM | POA: Diagnosis not present

## 2012-02-27 DIAGNOSIS — I10 Essential (primary) hypertension: Secondary | ICD-10-CM | POA: Diagnosis not present

## 2012-02-27 DIAGNOSIS — D510 Vitamin B12 deficiency anemia due to intrinsic factor deficiency: Secondary | ICD-10-CM | POA: Diagnosis not present

## 2012-02-28 DIAGNOSIS — I10 Essential (primary) hypertension: Secondary | ICD-10-CM | POA: Diagnosis not present

## 2012-02-28 DIAGNOSIS — D510 Vitamin B12 deficiency anemia due to intrinsic factor deficiency: Secondary | ICD-10-CM | POA: Diagnosis not present

## 2012-02-28 DIAGNOSIS — J441 Chronic obstructive pulmonary disease with (acute) exacerbation: Secondary | ICD-10-CM | POA: Diagnosis not present

## 2012-02-29 DIAGNOSIS — J441 Chronic obstructive pulmonary disease with (acute) exacerbation: Secondary | ICD-10-CM | POA: Diagnosis not present

## 2012-02-29 DIAGNOSIS — D510 Vitamin B12 deficiency anemia due to intrinsic factor deficiency: Secondary | ICD-10-CM | POA: Diagnosis not present

## 2012-02-29 DIAGNOSIS — I10 Essential (primary) hypertension: Secondary | ICD-10-CM | POA: Diagnosis not present

## 2012-03-03 DIAGNOSIS — D510 Vitamin B12 deficiency anemia due to intrinsic factor deficiency: Secondary | ICD-10-CM | POA: Diagnosis not present

## 2012-03-03 DIAGNOSIS — I10 Essential (primary) hypertension: Secondary | ICD-10-CM | POA: Diagnosis not present

## 2012-03-03 DIAGNOSIS — J441 Chronic obstructive pulmonary disease with (acute) exacerbation: Secondary | ICD-10-CM | POA: Diagnosis not present

## 2012-03-11 DIAGNOSIS — M25569 Pain in unspecified knee: Secondary | ICD-10-CM | POA: Diagnosis not present

## 2012-03-11 DIAGNOSIS — D649 Anemia, unspecified: Secondary | ICD-10-CM | POA: Diagnosis not present

## 2012-03-18 DIAGNOSIS — Z9889 Other specified postprocedural states: Secondary | ICD-10-CM | POA: Diagnosis not present

## 2012-03-18 DIAGNOSIS — I2510 Atherosclerotic heart disease of native coronary artery without angina pectoris: Secondary | ICD-10-CM | POA: Diagnosis not present

## 2012-03-18 DIAGNOSIS — R002 Palpitations: Secondary | ICD-10-CM | POA: Diagnosis not present

## 2012-03-18 DIAGNOSIS — I10 Essential (primary) hypertension: Secondary | ICD-10-CM | POA: Diagnosis not present

## 2012-03-20 DIAGNOSIS — M1710 Unilateral primary osteoarthritis, unspecified knee: Secondary | ICD-10-CM | POA: Diagnosis not present

## 2012-03-20 DIAGNOSIS — M25569 Pain in unspecified knee: Secondary | ICD-10-CM | POA: Diagnosis not present

## 2012-03-25 DIAGNOSIS — IMO0002 Reserved for concepts with insufficient information to code with codable children: Secondary | ICD-10-CM | POA: Diagnosis not present

## 2012-03-25 DIAGNOSIS — I10 Essential (primary) hypertension: Secondary | ICD-10-CM | POA: Diagnosis not present

## 2012-03-25 DIAGNOSIS — M1990 Unspecified osteoarthritis, unspecified site: Secondary | ICD-10-CM | POA: Diagnosis not present

## 2012-03-25 DIAGNOSIS — J449 Chronic obstructive pulmonary disease, unspecified: Secondary | ICD-10-CM | POA: Diagnosis not present

## 2012-03-25 DIAGNOSIS — D539 Nutritional anemia, unspecified: Secondary | ICD-10-CM | POA: Diagnosis not present

## 2012-04-02 DIAGNOSIS — H40059 Ocular hypertension, unspecified eye: Secondary | ICD-10-CM | POA: Diagnosis not present

## 2012-04-04 DIAGNOSIS — Z0389 Encounter for observation for other suspected diseases and conditions ruled out: Secondary | ICD-10-CM | POA: Diagnosis not present

## 2012-04-04 DIAGNOSIS — R220 Localized swelling, mass and lump, head: Secondary | ICD-10-CM | POA: Diagnosis not present

## 2012-04-08 DIAGNOSIS — D539 Nutritional anemia, unspecified: Secondary | ICD-10-CM | POA: Diagnosis not present

## 2012-04-26 DIAGNOSIS — M25569 Pain in unspecified knee: Secondary | ICD-10-CM | POA: Diagnosis not present

## 2012-04-26 DIAGNOSIS — M1710 Unilateral primary osteoarthritis, unspecified knee: Secondary | ICD-10-CM | POA: Diagnosis not present

## 2012-04-30 DIAGNOSIS — M1710 Unilateral primary osteoarthritis, unspecified knee: Secondary | ICD-10-CM | POA: Diagnosis not present

## 2012-05-01 HISTORY — PX: COLON SURGERY: SHX602

## 2012-05-06 DIAGNOSIS — I10 Essential (primary) hypertension: Secondary | ICD-10-CM | POA: Diagnosis not present

## 2012-05-06 DIAGNOSIS — IMO0002 Reserved for concepts with insufficient information to code with codable children: Secondary | ICD-10-CM | POA: Diagnosis not present

## 2012-05-06 DIAGNOSIS — D539 Nutritional anemia, unspecified: Secondary | ICD-10-CM | POA: Diagnosis not present

## 2012-05-06 DIAGNOSIS — K5900 Constipation, unspecified: Secondary | ICD-10-CM | POA: Diagnosis not present

## 2012-05-06 DIAGNOSIS — E785 Hyperlipidemia, unspecified: Secondary | ICD-10-CM | POA: Diagnosis not present

## 2012-05-07 DIAGNOSIS — M1710 Unilateral primary osteoarthritis, unspecified knee: Secondary | ICD-10-CM | POA: Diagnosis not present

## 2012-05-14 DIAGNOSIS — M1710 Unilateral primary osteoarthritis, unspecified knee: Secondary | ICD-10-CM | POA: Diagnosis not present

## 2012-06-17 DIAGNOSIS — I10 Essential (primary) hypertension: Secondary | ICD-10-CM | POA: Diagnosis not present

## 2012-06-17 DIAGNOSIS — D539 Nutritional anemia, unspecified: Secondary | ICD-10-CM | POA: Diagnosis not present

## 2012-06-17 DIAGNOSIS — IMO0002 Reserved for concepts with insufficient information to code with codable children: Secondary | ICD-10-CM | POA: Diagnosis not present

## 2012-07-16 DIAGNOSIS — I10 Essential (primary) hypertension: Secondary | ICD-10-CM | POA: Diagnosis not present

## 2012-07-16 DIAGNOSIS — K922 Gastrointestinal hemorrhage, unspecified: Secondary | ICD-10-CM | POA: Diagnosis not present

## 2012-07-16 DIAGNOSIS — I2510 Atherosclerotic heart disease of native coronary artery without angina pectoris: Secondary | ICD-10-CM | POA: Diagnosis not present

## 2012-07-16 DIAGNOSIS — R002 Palpitations: Secondary | ICD-10-CM | POA: Diagnosis not present

## 2012-07-16 DIAGNOSIS — Z9889 Other specified postprocedural states: Secondary | ICD-10-CM | POA: Diagnosis not present

## 2012-08-02 DIAGNOSIS — IMO0002 Reserved for concepts with insufficient information to code with codable children: Secondary | ICD-10-CM | POA: Diagnosis not present

## 2012-08-02 DIAGNOSIS — J449 Chronic obstructive pulmonary disease, unspecified: Secondary | ICD-10-CM | POA: Diagnosis not present

## 2012-08-02 DIAGNOSIS — E785 Hyperlipidemia, unspecified: Secondary | ICD-10-CM | POA: Diagnosis not present

## 2012-08-02 DIAGNOSIS — D539 Nutritional anemia, unspecified: Secondary | ICD-10-CM | POA: Diagnosis not present

## 2012-08-02 DIAGNOSIS — I10 Essential (primary) hypertension: Secondary | ICD-10-CM | POA: Diagnosis not present

## 2012-08-05 DIAGNOSIS — L821 Other seborrheic keratosis: Secondary | ICD-10-CM | POA: Diagnosis not present

## 2012-08-05 DIAGNOSIS — L570 Actinic keratosis: Secondary | ICD-10-CM | POA: Diagnosis not present

## 2012-08-07 DIAGNOSIS — H40059 Ocular hypertension, unspecified eye: Secondary | ICD-10-CM | POA: Diagnosis not present

## 2012-09-03 DIAGNOSIS — H40059 Ocular hypertension, unspecified eye: Secondary | ICD-10-CM | POA: Diagnosis not present

## 2012-10-13 DIAGNOSIS — E7800 Pure hypercholesterolemia, unspecified: Secondary | ICD-10-CM | POA: Diagnosis not present

## 2012-10-13 DIAGNOSIS — J4489 Other specified chronic obstructive pulmonary disease: Secondary | ICD-10-CM | POA: Diagnosis not present

## 2012-10-13 DIAGNOSIS — J449 Chronic obstructive pulmonary disease, unspecified: Secondary | ICD-10-CM | POA: Diagnosis not present

## 2012-10-13 DIAGNOSIS — K219 Gastro-esophageal reflux disease without esophagitis: Secondary | ICD-10-CM | POA: Diagnosis not present

## 2012-10-13 DIAGNOSIS — I10 Essential (primary) hypertension: Secondary | ICD-10-CM | POA: Diagnosis not present

## 2012-10-13 DIAGNOSIS — J441 Chronic obstructive pulmonary disease with (acute) exacerbation: Secondary | ICD-10-CM | POA: Diagnosis not present

## 2012-10-13 DIAGNOSIS — R0602 Shortness of breath: Secondary | ICD-10-CM | POA: Diagnosis not present

## 2012-11-06 DIAGNOSIS — H40059 Ocular hypertension, unspecified eye: Secondary | ICD-10-CM | POA: Diagnosis not present

## 2012-11-08 DIAGNOSIS — Z9181 History of falling: Secondary | ICD-10-CM | POA: Diagnosis not present

## 2012-11-08 DIAGNOSIS — I10 Essential (primary) hypertension: Secondary | ICD-10-CM | POA: Diagnosis not present

## 2012-11-08 DIAGNOSIS — IMO0002 Reserved for concepts with insufficient information to code with codable children: Secondary | ICD-10-CM | POA: Diagnosis not present

## 2012-11-08 DIAGNOSIS — E785 Hyperlipidemia, unspecified: Secondary | ICD-10-CM | POA: Diagnosis not present

## 2012-11-08 DIAGNOSIS — J449 Chronic obstructive pulmonary disease, unspecified: Secondary | ICD-10-CM | POA: Diagnosis not present

## 2012-12-27 DIAGNOSIS — J449 Chronic obstructive pulmonary disease, unspecified: Secondary | ICD-10-CM | POA: Diagnosis not present

## 2012-12-27 DIAGNOSIS — IMO0002 Reserved for concepts with insufficient information to code with codable children: Secondary | ICD-10-CM | POA: Diagnosis not present

## 2012-12-27 DIAGNOSIS — I10 Essential (primary) hypertension: Secondary | ICD-10-CM | POA: Diagnosis not present

## 2013-01-20 DIAGNOSIS — M2040 Other hammer toe(s) (acquired), unspecified foot: Secondary | ICD-10-CM | POA: Diagnosis not present

## 2013-01-20 DIAGNOSIS — M7750 Other enthesopathy of unspecified foot: Secondary | ICD-10-CM | POA: Diagnosis not present

## 2013-01-20 DIAGNOSIS — Q828 Other specified congenital malformations of skin: Secondary | ICD-10-CM | POA: Diagnosis not present

## 2013-02-05 DIAGNOSIS — Z9889 Other specified postprocedural states: Secondary | ICD-10-CM | POA: Diagnosis not present

## 2013-02-05 DIAGNOSIS — K922 Gastrointestinal hemorrhage, unspecified: Secondary | ICD-10-CM | POA: Diagnosis not present

## 2013-02-05 DIAGNOSIS — R002 Palpitations: Secondary | ICD-10-CM | POA: Diagnosis not present

## 2013-02-05 DIAGNOSIS — I10 Essential (primary) hypertension: Secondary | ICD-10-CM | POA: Diagnosis not present

## 2013-02-05 DIAGNOSIS — I2510 Atherosclerotic heart disease of native coronary artery without angina pectoris: Secondary | ICD-10-CM | POA: Diagnosis not present

## 2013-02-05 DIAGNOSIS — R52 Pain, unspecified: Secondary | ICD-10-CM | POA: Diagnosis not present

## 2013-02-07 DIAGNOSIS — Z23 Encounter for immunization: Secondary | ICD-10-CM | POA: Diagnosis not present

## 2013-02-07 DIAGNOSIS — IMO0002 Reserved for concepts with insufficient information to code with codable children: Secondary | ICD-10-CM | POA: Diagnosis not present

## 2013-02-07 DIAGNOSIS — I10 Essential (primary) hypertension: Secondary | ICD-10-CM | POA: Diagnosis not present

## 2013-02-07 DIAGNOSIS — J449 Chronic obstructive pulmonary disease, unspecified: Secondary | ICD-10-CM | POA: Diagnosis not present

## 2013-02-13 DIAGNOSIS — S4980XA Other specified injuries of shoulder and upper arm, unspecified arm, initial encounter: Secondary | ICD-10-CM | POA: Diagnosis not present

## 2013-02-13 DIAGNOSIS — T148XXA Other injury of unspecified body region, initial encounter: Secondary | ICD-10-CM | POA: Diagnosis not present

## 2013-02-13 DIAGNOSIS — S42209A Unspecified fracture of upper end of unspecified humerus, initial encounter for closed fracture: Secondary | ICD-10-CM | POA: Diagnosis not present

## 2013-02-13 DIAGNOSIS — Y92009 Unspecified place in unspecified non-institutional (private) residence as the place of occurrence of the external cause: Secondary | ICD-10-CM | POA: Diagnosis not present

## 2013-02-13 DIAGNOSIS — R296 Repeated falls: Secondary | ICD-10-CM | POA: Diagnosis not present

## 2013-02-14 DIAGNOSIS — S42213A Unspecified displaced fracture of surgical neck of unspecified humerus, initial encounter for closed fracture: Secondary | ICD-10-CM | POA: Diagnosis not present

## 2013-02-18 DIAGNOSIS — IMO0002 Reserved for concepts with insufficient information to code with codable children: Secondary | ICD-10-CM | POA: Diagnosis not present

## 2013-02-18 DIAGNOSIS — Z111 Encounter for screening for respiratory tuberculosis: Secondary | ICD-10-CM | POA: Diagnosis not present

## 2013-02-18 DIAGNOSIS — I10 Essential (primary) hypertension: Secondary | ICD-10-CM | POA: Diagnosis not present

## 2013-02-18 DIAGNOSIS — J449 Chronic obstructive pulmonary disease, unspecified: Secondary | ICD-10-CM | POA: Diagnosis not present

## 2013-02-18 DIAGNOSIS — S42213A Unspecified displaced fracture of surgical neck of unspecified humerus, initial encounter for closed fracture: Secondary | ICD-10-CM | POA: Diagnosis not present

## 2013-02-21 DIAGNOSIS — S42213A Unspecified displaced fracture of surgical neck of unspecified humerus, initial encounter for closed fracture: Secondary | ICD-10-CM | POA: Diagnosis not present

## 2013-02-24 DIAGNOSIS — S42213A Unspecified displaced fracture of surgical neck of unspecified humerus, initial encounter for closed fracture: Secondary | ICD-10-CM | POA: Diagnosis not present

## 2013-02-25 DIAGNOSIS — S42213A Unspecified displaced fracture of surgical neck of unspecified humerus, initial encounter for closed fracture: Secondary | ICD-10-CM | POA: Diagnosis not present

## 2013-03-10 DIAGNOSIS — E785 Hyperlipidemia, unspecified: Secondary | ICD-10-CM | POA: Diagnosis not present

## 2013-03-10 DIAGNOSIS — I10 Essential (primary) hypertension: Secondary | ICD-10-CM | POA: Diagnosis not present

## 2013-03-10 DIAGNOSIS — D539 Nutritional anemia, unspecified: Secondary | ICD-10-CM | POA: Diagnosis not present

## 2013-03-10 DIAGNOSIS — S42213A Unspecified displaced fracture of surgical neck of unspecified humerus, initial encounter for closed fracture: Secondary | ICD-10-CM | POA: Diagnosis not present

## 2013-03-10 DIAGNOSIS — J449 Chronic obstructive pulmonary disease, unspecified: Secondary | ICD-10-CM | POA: Diagnosis not present

## 2013-03-25 DIAGNOSIS — S42213A Unspecified displaced fracture of surgical neck of unspecified humerus, initial encounter for closed fracture: Secondary | ICD-10-CM | POA: Diagnosis not present

## 2013-03-28 DIAGNOSIS — M24519 Contracture, unspecified shoulder: Secondary | ICD-10-CM | POA: Diagnosis not present

## 2013-03-28 DIAGNOSIS — S42309D Unspecified fracture of shaft of humerus, unspecified arm, subsequent encounter for fracture with routine healing: Secondary | ICD-10-CM | POA: Diagnosis not present

## 2013-03-28 DIAGNOSIS — IMO0001 Reserved for inherently not codable concepts without codable children: Secondary | ICD-10-CM | POA: Diagnosis not present

## 2013-03-28 DIAGNOSIS — M25519 Pain in unspecified shoulder: Secondary | ICD-10-CM | POA: Diagnosis not present

## 2013-04-01 DIAGNOSIS — S42309D Unspecified fracture of shaft of humerus, unspecified arm, subsequent encounter for fracture with routine healing: Secondary | ICD-10-CM | POA: Diagnosis not present

## 2013-04-01 DIAGNOSIS — IMO0001 Reserved for inherently not codable concepts without codable children: Secondary | ICD-10-CM | POA: Diagnosis not present

## 2013-04-01 DIAGNOSIS — M24519 Contracture, unspecified shoulder: Secondary | ICD-10-CM | POA: Diagnosis not present

## 2013-04-01 DIAGNOSIS — M25519 Pain in unspecified shoulder: Secondary | ICD-10-CM | POA: Diagnosis not present

## 2013-04-03 DIAGNOSIS — M25519 Pain in unspecified shoulder: Secondary | ICD-10-CM | POA: Diagnosis not present

## 2013-04-03 DIAGNOSIS — S42309D Unspecified fracture of shaft of humerus, unspecified arm, subsequent encounter for fracture with routine healing: Secondary | ICD-10-CM | POA: Diagnosis not present

## 2013-04-03 DIAGNOSIS — M24519 Contracture, unspecified shoulder: Secondary | ICD-10-CM | POA: Diagnosis not present

## 2013-04-03 DIAGNOSIS — IMO0001 Reserved for inherently not codable concepts without codable children: Secondary | ICD-10-CM | POA: Diagnosis not present

## 2013-04-07 DIAGNOSIS — H40059 Ocular hypertension, unspecified eye: Secondary | ICD-10-CM | POA: Diagnosis not present

## 2013-04-08 DIAGNOSIS — S42309D Unspecified fracture of shaft of humerus, unspecified arm, subsequent encounter for fracture with routine healing: Secondary | ICD-10-CM | POA: Diagnosis not present

## 2013-04-08 DIAGNOSIS — IMO0001 Reserved for inherently not codable concepts without codable children: Secondary | ICD-10-CM | POA: Diagnosis not present

## 2013-04-08 DIAGNOSIS — M24519 Contracture, unspecified shoulder: Secondary | ICD-10-CM | POA: Diagnosis not present

## 2013-04-08 DIAGNOSIS — M25519 Pain in unspecified shoulder: Secondary | ICD-10-CM | POA: Diagnosis not present

## 2013-04-10 DIAGNOSIS — S42309D Unspecified fracture of shaft of humerus, unspecified arm, subsequent encounter for fracture with routine healing: Secondary | ICD-10-CM | POA: Diagnosis not present

## 2013-04-10 DIAGNOSIS — M24519 Contracture, unspecified shoulder: Secondary | ICD-10-CM | POA: Diagnosis not present

## 2013-04-10 DIAGNOSIS — IMO0001 Reserved for inherently not codable concepts without codable children: Secondary | ICD-10-CM | POA: Diagnosis not present

## 2013-04-10 DIAGNOSIS — M25519 Pain in unspecified shoulder: Secondary | ICD-10-CM | POA: Diagnosis not present

## 2013-04-15 DIAGNOSIS — M25519 Pain in unspecified shoulder: Secondary | ICD-10-CM | POA: Diagnosis not present

## 2013-04-15 DIAGNOSIS — M24519 Contracture, unspecified shoulder: Secondary | ICD-10-CM | POA: Diagnosis not present

## 2013-04-15 DIAGNOSIS — S42309D Unspecified fracture of shaft of humerus, unspecified arm, subsequent encounter for fracture with routine healing: Secondary | ICD-10-CM | POA: Diagnosis not present

## 2013-04-15 DIAGNOSIS — IMO0001 Reserved for inherently not codable concepts without codable children: Secondary | ICD-10-CM | POA: Diagnosis not present

## 2013-04-17 DIAGNOSIS — IMO0001 Reserved for inherently not codable concepts without codable children: Secondary | ICD-10-CM | POA: Diagnosis not present

## 2013-04-17 DIAGNOSIS — S42309D Unspecified fracture of shaft of humerus, unspecified arm, subsequent encounter for fracture with routine healing: Secondary | ICD-10-CM | POA: Diagnosis not present

## 2013-04-17 DIAGNOSIS — M24519 Contracture, unspecified shoulder: Secondary | ICD-10-CM | POA: Diagnosis not present

## 2013-04-17 DIAGNOSIS — M25519 Pain in unspecified shoulder: Secondary | ICD-10-CM | POA: Diagnosis not present

## 2013-04-21 DIAGNOSIS — S42213A Unspecified displaced fracture of surgical neck of unspecified humerus, initial encounter for closed fracture: Secondary | ICD-10-CM | POA: Diagnosis not present

## 2013-04-22 DIAGNOSIS — S42309D Unspecified fracture of shaft of humerus, unspecified arm, subsequent encounter for fracture with routine healing: Secondary | ICD-10-CM | POA: Diagnosis not present

## 2013-04-22 DIAGNOSIS — M24519 Contracture, unspecified shoulder: Secondary | ICD-10-CM | POA: Diagnosis not present

## 2013-04-22 DIAGNOSIS — M25519 Pain in unspecified shoulder: Secondary | ICD-10-CM | POA: Diagnosis not present

## 2013-04-22 DIAGNOSIS — IMO0001 Reserved for inherently not codable concepts without codable children: Secondary | ICD-10-CM | POA: Diagnosis not present

## 2013-04-23 DIAGNOSIS — M25519 Pain in unspecified shoulder: Secondary | ICD-10-CM | POA: Diagnosis not present

## 2013-04-23 DIAGNOSIS — M24519 Contracture, unspecified shoulder: Secondary | ICD-10-CM | POA: Diagnosis not present

## 2013-04-23 DIAGNOSIS — IMO0001 Reserved for inherently not codable concepts without codable children: Secondary | ICD-10-CM | POA: Diagnosis not present

## 2013-04-23 DIAGNOSIS — S42309D Unspecified fracture of shaft of humerus, unspecified arm, subsequent encounter for fracture with routine healing: Secondary | ICD-10-CM | POA: Diagnosis not present

## 2013-04-28 DIAGNOSIS — M25519 Pain in unspecified shoulder: Secondary | ICD-10-CM | POA: Diagnosis not present

## 2013-04-28 DIAGNOSIS — IMO0001 Reserved for inherently not codable concepts without codable children: Secondary | ICD-10-CM | POA: Diagnosis not present

## 2013-04-28 DIAGNOSIS — S42309D Unspecified fracture of shaft of humerus, unspecified arm, subsequent encounter for fracture with routine healing: Secondary | ICD-10-CM | POA: Diagnosis not present

## 2013-04-28 DIAGNOSIS — M24519 Contracture, unspecified shoulder: Secondary | ICD-10-CM | POA: Diagnosis not present

## 2013-04-30 DIAGNOSIS — M24519 Contracture, unspecified shoulder: Secondary | ICD-10-CM | POA: Diagnosis not present

## 2013-04-30 DIAGNOSIS — IMO0001 Reserved for inherently not codable concepts without codable children: Secondary | ICD-10-CM | POA: Diagnosis not present

## 2013-04-30 DIAGNOSIS — M25519 Pain in unspecified shoulder: Secondary | ICD-10-CM | POA: Diagnosis not present

## 2013-04-30 DIAGNOSIS — S42309D Unspecified fracture of shaft of humerus, unspecified arm, subsequent encounter for fracture with routine healing: Secondary | ICD-10-CM | POA: Diagnosis not present

## 2013-05-06 DIAGNOSIS — M24519 Contracture, unspecified shoulder: Secondary | ICD-10-CM | POA: Diagnosis not present

## 2013-05-06 DIAGNOSIS — M25519 Pain in unspecified shoulder: Secondary | ICD-10-CM | POA: Diagnosis not present

## 2013-05-06 DIAGNOSIS — S42309D Unspecified fracture of shaft of humerus, unspecified arm, subsequent encounter for fracture with routine healing: Secondary | ICD-10-CM | POA: Diagnosis not present

## 2013-05-06 DIAGNOSIS — IMO0001 Reserved for inherently not codable concepts without codable children: Secondary | ICD-10-CM | POA: Diagnosis not present

## 2013-05-08 DIAGNOSIS — M25519 Pain in unspecified shoulder: Secondary | ICD-10-CM | POA: Diagnosis not present

## 2013-05-08 DIAGNOSIS — IMO0001 Reserved for inherently not codable concepts without codable children: Secondary | ICD-10-CM | POA: Diagnosis not present

## 2013-05-08 DIAGNOSIS — S42309D Unspecified fracture of shaft of humerus, unspecified arm, subsequent encounter for fracture with routine healing: Secondary | ICD-10-CM | POA: Diagnosis not present

## 2013-05-08 DIAGNOSIS — M24519 Contracture, unspecified shoulder: Secondary | ICD-10-CM | POA: Diagnosis not present

## 2013-06-05 DIAGNOSIS — I10 Essential (primary) hypertension: Secondary | ICD-10-CM | POA: Diagnosis not present

## 2013-06-05 DIAGNOSIS — K922 Gastrointestinal hemorrhage, unspecified: Secondary | ICD-10-CM | POA: Diagnosis not present

## 2013-06-05 DIAGNOSIS — I2510 Atherosclerotic heart disease of native coronary artery without angina pectoris: Secondary | ICD-10-CM | POA: Diagnosis not present

## 2013-06-05 DIAGNOSIS — Z9889 Other specified postprocedural states: Secondary | ICD-10-CM | POA: Diagnosis not present

## 2013-07-10 DIAGNOSIS — D539 Nutritional anemia, unspecified: Secondary | ICD-10-CM | POA: Diagnosis not present

## 2013-07-10 DIAGNOSIS — Z23 Encounter for immunization: Secondary | ICD-10-CM | POA: Diagnosis not present

## 2013-07-10 DIAGNOSIS — J4489 Other specified chronic obstructive pulmonary disease: Secondary | ICD-10-CM | POA: Diagnosis not present

## 2013-07-10 DIAGNOSIS — J449 Chronic obstructive pulmonary disease, unspecified: Secondary | ICD-10-CM | POA: Diagnosis not present

## 2013-07-10 DIAGNOSIS — E785 Hyperlipidemia, unspecified: Secondary | ICD-10-CM | POA: Diagnosis not present

## 2013-07-10 DIAGNOSIS — IMO0002 Reserved for concepts with insufficient information to code with codable children: Secondary | ICD-10-CM | POA: Diagnosis not present

## 2013-07-10 DIAGNOSIS — I10 Essential (primary) hypertension: Secondary | ICD-10-CM | POA: Diagnosis not present

## 2013-07-21 DIAGNOSIS — S42213A Unspecified displaced fracture of surgical neck of unspecified humerus, initial encounter for closed fracture: Secondary | ICD-10-CM | POA: Diagnosis not present

## 2013-08-22 DIAGNOSIS — H40059 Ocular hypertension, unspecified eye: Secondary | ICD-10-CM | POA: Diagnosis not present

## 2013-09-29 DIAGNOSIS — R1011 Right upper quadrant pain: Secondary | ICD-10-CM | POA: Diagnosis not present

## 2013-09-29 DIAGNOSIS — Z6825 Body mass index (BMI) 25.0-25.9, adult: Secondary | ICD-10-CM | POA: Diagnosis not present

## 2013-10-08 DIAGNOSIS — R1011 Right upper quadrant pain: Secondary | ICD-10-CM | POA: Diagnosis not present

## 2013-10-08 DIAGNOSIS — Z85038 Personal history of other malignant neoplasm of large intestine: Secondary | ICD-10-CM | POA: Diagnosis not present

## 2013-10-08 DIAGNOSIS — K5730 Diverticulosis of large intestine without perforation or abscess without bleeding: Secondary | ICD-10-CM | POA: Diagnosis not present

## 2013-10-13 DIAGNOSIS — I10 Essential (primary) hypertension: Secondary | ICD-10-CM | POA: Diagnosis not present

## 2013-10-13 DIAGNOSIS — E785 Hyperlipidemia, unspecified: Secondary | ICD-10-CM | POA: Diagnosis not present

## 2013-10-13 DIAGNOSIS — J449 Chronic obstructive pulmonary disease, unspecified: Secondary | ICD-10-CM | POA: Diagnosis not present

## 2013-10-27 DIAGNOSIS — Z1231 Encounter for screening mammogram for malignant neoplasm of breast: Secondary | ICD-10-CM | POA: Diagnosis not present

## 2013-10-27 DIAGNOSIS — M899 Disorder of bone, unspecified: Secondary | ICD-10-CM | POA: Diagnosis not present

## 2013-11-10 DIAGNOSIS — R1011 Right upper quadrant pain: Secondary | ICD-10-CM | POA: Diagnosis not present

## 2013-11-10 DIAGNOSIS — K219 Gastro-esophageal reflux disease without esophagitis: Secondary | ICD-10-CM | POA: Diagnosis not present

## 2013-11-10 DIAGNOSIS — K5730 Diverticulosis of large intestine without perforation or abscess without bleeding: Secondary | ICD-10-CM | POA: Diagnosis not present

## 2013-12-08 DIAGNOSIS — Z1331 Encounter for screening for depression: Secondary | ICD-10-CM | POA: Diagnosis not present

## 2013-12-08 DIAGNOSIS — Z6825 Body mass index (BMI) 25.0-25.9, adult: Secondary | ICD-10-CM | POA: Diagnosis not present

## 2013-12-08 DIAGNOSIS — R109 Unspecified abdominal pain: Secondary | ICD-10-CM | POA: Diagnosis not present

## 2013-12-08 DIAGNOSIS — Z9181 History of falling: Secondary | ICD-10-CM | POA: Diagnosis not present

## 2013-12-13 DIAGNOSIS — R1011 Right upper quadrant pain: Secondary | ICD-10-CM | POA: Diagnosis not present

## 2013-12-13 DIAGNOSIS — Q445 Other congenital malformations of bile ducts: Secondary | ICD-10-CM | POA: Diagnosis not present

## 2013-12-13 DIAGNOSIS — R109 Unspecified abdominal pain: Secondary | ICD-10-CM | POA: Diagnosis not present

## 2013-12-13 DIAGNOSIS — Q447 Other congenital malformations of liver: Secondary | ICD-10-CM | POA: Diagnosis not present

## 2013-12-13 DIAGNOSIS — Q441 Other congenital malformations of gallbladder: Secondary | ICD-10-CM | POA: Diagnosis not present

## 2013-12-22 DIAGNOSIS — Q619 Cystic kidney disease, unspecified: Secondary | ICD-10-CM | POA: Diagnosis not present

## 2013-12-22 DIAGNOSIS — R109 Unspecified abdominal pain: Secondary | ICD-10-CM | POA: Diagnosis not present

## 2013-12-22 DIAGNOSIS — Z9889 Other specified postprocedural states: Secondary | ICD-10-CM | POA: Diagnosis not present

## 2013-12-22 DIAGNOSIS — N281 Cyst of kidney, acquired: Secondary | ICD-10-CM | POA: Diagnosis not present

## 2013-12-22 DIAGNOSIS — R933 Abnormal findings on diagnostic imaging of other parts of digestive tract: Secondary | ICD-10-CM | POA: Diagnosis not present

## 2013-12-22 DIAGNOSIS — K5730 Diverticulosis of large intestine without perforation or abscess without bleeding: Secondary | ICD-10-CM | POA: Diagnosis not present

## 2013-12-22 DIAGNOSIS — K838 Other specified diseases of biliary tract: Secondary | ICD-10-CM | POA: Diagnosis not present

## 2013-12-22 DIAGNOSIS — Z9089 Acquired absence of other organs: Secondary | ICD-10-CM | POA: Diagnosis not present

## 2013-12-25 DIAGNOSIS — N390 Urinary tract infection, site not specified: Secondary | ICD-10-CM | POA: Diagnosis not present

## 2013-12-25 DIAGNOSIS — N281 Cyst of kidney, acquired: Secondary | ICD-10-CM | POA: Diagnosis not present

## 2013-12-29 DIAGNOSIS — Z6825 Body mass index (BMI) 25.0-25.9, adult: Secondary | ICD-10-CM | POA: Diagnosis not present

## 2013-12-29 DIAGNOSIS — R109 Unspecified abdominal pain: Secondary | ICD-10-CM | POA: Diagnosis not present

## 2014-01-02 DIAGNOSIS — I10 Essential (primary) hypertension: Secondary | ICD-10-CM | POA: Diagnosis not present

## 2014-01-02 DIAGNOSIS — Z9049 Acquired absence of other specified parts of digestive tract: Secondary | ICD-10-CM | POA: Diagnosis not present

## 2014-01-02 DIAGNOSIS — R002 Palpitations: Secondary | ICD-10-CM | POA: Diagnosis not present

## 2014-01-02 DIAGNOSIS — I2510 Atherosclerotic heart disease of native coronary artery without angina pectoris: Secondary | ICD-10-CM | POA: Diagnosis not present

## 2014-01-20 DIAGNOSIS — R002 Palpitations: Secondary | ICD-10-CM | POA: Diagnosis not present

## 2014-01-20 DIAGNOSIS — I2510 Atherosclerotic heart disease of native coronary artery without angina pectoris: Secondary | ICD-10-CM | POA: Diagnosis not present

## 2014-01-20 DIAGNOSIS — I10 Essential (primary) hypertension: Secondary | ICD-10-CM | POA: Diagnosis not present

## 2014-01-26 DIAGNOSIS — H4011X0 Primary open-angle glaucoma, stage unspecified: Secondary | ICD-10-CM | POA: Diagnosis not present

## 2014-01-27 DIAGNOSIS — H3400 Transient retinal artery occlusion, unspecified eye: Secondary | ICD-10-CM | POA: Diagnosis not present

## 2014-01-27 DIAGNOSIS — K589 Irritable bowel syndrome without diarrhea: Secondary | ICD-10-CM | POA: Diagnosis not present

## 2014-01-27 DIAGNOSIS — Z6825 Body mass index (BMI) 25.0-25.9, adult: Secondary | ICD-10-CM | POA: Diagnosis not present

## 2014-02-02 DIAGNOSIS — J811 Chronic pulmonary edema: Secondary | ICD-10-CM | POA: Diagnosis not present

## 2014-02-02 DIAGNOSIS — F419 Anxiety disorder, unspecified: Secondary | ICD-10-CM | POA: Diagnosis present

## 2014-02-02 DIAGNOSIS — Z7902 Long term (current) use of antithrombotics/antiplatelets: Secondary | ICD-10-CM | POA: Diagnosis not present

## 2014-02-02 DIAGNOSIS — R109 Unspecified abdominal pain: Secondary | ICD-10-CM | POA: Diagnosis not present

## 2014-02-02 DIAGNOSIS — R000 Tachycardia, unspecified: Secondary | ICD-10-CM | POA: Diagnosis not present

## 2014-02-02 DIAGNOSIS — J9611 Chronic respiratory failure with hypoxia: Secondary | ICD-10-CM | POA: Diagnosis not present

## 2014-02-02 DIAGNOSIS — K5730 Diverticulosis of large intestine without perforation or abscess without bleeding: Secondary | ICD-10-CM | POA: Diagnosis not present

## 2014-02-02 DIAGNOSIS — I5032 Chronic diastolic (congestive) heart failure: Secondary | ICD-10-CM | POA: Diagnosis not present

## 2014-02-02 DIAGNOSIS — Z79899 Other long term (current) drug therapy: Secondary | ICD-10-CM | POA: Diagnosis not present

## 2014-02-02 DIAGNOSIS — K219 Gastro-esophageal reflux disease without esophagitis: Secondary | ICD-10-CM | POA: Diagnosis present

## 2014-02-02 DIAGNOSIS — Z8673 Personal history of transient ischemic attack (TIA), and cerebral infarction without residual deficits: Secondary | ICD-10-CM | POA: Diagnosis not present

## 2014-02-02 DIAGNOSIS — J449 Chronic obstructive pulmonary disease, unspecified: Secondary | ICD-10-CM | POA: Diagnosis not present

## 2014-02-02 DIAGNOSIS — R9431 Abnormal electrocardiogram [ECG] [EKG]: Secondary | ICD-10-CM | POA: Diagnosis not present

## 2014-02-02 DIAGNOSIS — E860 Dehydration: Secondary | ICD-10-CM | POA: Diagnosis not present

## 2014-02-02 DIAGNOSIS — R1084 Generalized abdominal pain: Secondary | ICD-10-CM | POA: Diagnosis not present

## 2014-02-02 DIAGNOSIS — M1990 Unspecified osteoarthritis, unspecified site: Secondary | ICD-10-CM | POA: Diagnosis present

## 2014-02-02 DIAGNOSIS — I4891 Unspecified atrial fibrillation: Secondary | ICD-10-CM | POA: Diagnosis present

## 2014-02-02 DIAGNOSIS — R110 Nausea: Secondary | ICD-10-CM | POA: Diagnosis not present

## 2014-02-02 DIAGNOSIS — E780 Pure hypercholesterolemia: Secondary | ICD-10-CM | POA: Diagnosis present

## 2014-02-02 DIAGNOSIS — Z87891 Personal history of nicotine dependence: Secondary | ICD-10-CM | POA: Diagnosis not present

## 2014-02-02 DIAGNOSIS — Z85038 Personal history of other malignant neoplasm of large intestine: Secondary | ICD-10-CM | POA: Diagnosis not present

## 2014-02-02 DIAGNOSIS — I471 Supraventricular tachycardia: Secondary | ICD-10-CM | POA: Diagnosis not present

## 2014-02-02 DIAGNOSIS — Z4682 Encounter for fitting and adjustment of non-vascular catheter: Secondary | ICD-10-CM | POA: Diagnosis not present

## 2014-02-02 DIAGNOSIS — Z8572 Personal history of non-Hodgkin lymphomas: Secondary | ICD-10-CM | POA: Diagnosis not present

## 2014-02-02 DIAGNOSIS — K5660 Unspecified intestinal obstruction: Secondary | ICD-10-CM | POA: Diagnosis not present

## 2014-02-02 DIAGNOSIS — I10 Essential (primary) hypertension: Secondary | ICD-10-CM | POA: Diagnosis not present

## 2014-02-02 DIAGNOSIS — K5669 Other intestinal obstruction: Secondary | ICD-10-CM | POA: Diagnosis not present

## 2014-02-17 DIAGNOSIS — I5032 Chronic diastolic (congestive) heart failure: Secondary | ICD-10-CM | POA: Diagnosis not present

## 2014-02-17 DIAGNOSIS — K5669 Other intestinal obstruction: Secondary | ICD-10-CM | POA: Diagnosis not present

## 2014-02-17 DIAGNOSIS — K589 Irritable bowel syndrome without diarrhea: Secondary | ICD-10-CM | POA: Diagnosis not present

## 2014-02-17 DIAGNOSIS — Z6825 Body mass index (BMI) 25.0-25.9, adult: Secondary | ICD-10-CM | POA: Diagnosis not present

## 2014-02-17 DIAGNOSIS — I480 Paroxysmal atrial fibrillation: Secondary | ICD-10-CM | POA: Diagnosis not present

## 2014-02-17 DIAGNOSIS — M1990 Unspecified osteoarthritis, unspecified site: Secondary | ICD-10-CM | POA: Diagnosis not present

## 2014-02-17 DIAGNOSIS — I10 Essential (primary) hypertension: Secondary | ICD-10-CM | POA: Diagnosis not present

## 2014-02-27 DIAGNOSIS — H4011X3 Primary open-angle glaucoma, severe stage: Secondary | ICD-10-CM | POA: Diagnosis not present

## 2014-02-27 DIAGNOSIS — H4011X2 Primary open-angle glaucoma, moderate stage: Secondary | ICD-10-CM | POA: Diagnosis not present

## 2014-03-23 DIAGNOSIS — I480 Paroxysmal atrial fibrillation: Secondary | ICD-10-CM | POA: Diagnosis not present

## 2014-03-23 DIAGNOSIS — D539 Nutritional anemia, unspecified: Secondary | ICD-10-CM | POA: Diagnosis not present

## 2014-03-23 DIAGNOSIS — J449 Chronic obstructive pulmonary disease, unspecified: Secondary | ICD-10-CM | POA: Diagnosis not present

## 2014-03-23 DIAGNOSIS — R42 Dizziness and giddiness: Secondary | ICD-10-CM | POA: Diagnosis not present

## 2014-03-23 DIAGNOSIS — I10 Essential (primary) hypertension: Secondary | ICD-10-CM | POA: Diagnosis not present

## 2014-03-23 DIAGNOSIS — K589 Irritable bowel syndrome without diarrhea: Secondary | ICD-10-CM | POA: Diagnosis not present

## 2014-03-23 DIAGNOSIS — M1990 Unspecified osteoarthritis, unspecified site: Secondary | ICD-10-CM | POA: Diagnosis not present

## 2014-04-08 DIAGNOSIS — J449 Chronic obstructive pulmonary disease, unspecified: Secondary | ICD-10-CM | POA: Diagnosis not present

## 2014-05-05 DIAGNOSIS — I10 Essential (primary) hypertension: Secondary | ICD-10-CM | POA: Diagnosis not present

## 2014-05-05 DIAGNOSIS — F411 Generalized anxiety disorder: Secondary | ICD-10-CM | POA: Diagnosis not present

## 2014-05-05 DIAGNOSIS — I5032 Chronic diastolic (congestive) heart failure: Secondary | ICD-10-CM | POA: Diagnosis not present

## 2014-05-05 DIAGNOSIS — J449 Chronic obstructive pulmonary disease, unspecified: Secondary | ICD-10-CM | POA: Diagnosis not present

## 2014-05-05 DIAGNOSIS — M1990 Unspecified osteoarthritis, unspecified site: Secondary | ICD-10-CM | POA: Diagnosis not present

## 2014-05-05 DIAGNOSIS — D539 Nutritional anemia, unspecified: Secondary | ICD-10-CM | POA: Diagnosis not present

## 2014-06-11 DIAGNOSIS — R1032 Left lower quadrant pain: Secondary | ICD-10-CM | POA: Diagnosis not present

## 2014-06-11 DIAGNOSIS — K838 Other specified diseases of biliary tract: Secondary | ICD-10-CM | POA: Diagnosis not present

## 2014-06-11 DIAGNOSIS — Z7901 Long term (current) use of anticoagulants: Secondary | ICD-10-CM | POA: Diagnosis not present

## 2014-06-11 DIAGNOSIS — J449 Chronic obstructive pulmonary disease, unspecified: Secondary | ICD-10-CM | POA: Diagnosis not present

## 2014-06-11 DIAGNOSIS — R1033 Periumbilical pain: Secondary | ICD-10-CM | POA: Diagnosis not present

## 2014-06-11 DIAGNOSIS — I10 Essential (primary) hypertension: Secondary | ICD-10-CM | POA: Diagnosis not present

## 2014-06-11 DIAGNOSIS — K5730 Diverticulosis of large intestine without perforation or abscess without bleeding: Secondary | ICD-10-CM | POA: Diagnosis not present

## 2014-06-11 DIAGNOSIS — I509 Heart failure, unspecified: Secondary | ICD-10-CM | POA: Diagnosis not present

## 2014-06-11 DIAGNOSIS — N281 Cyst of kidney, acquired: Secondary | ICD-10-CM | POA: Diagnosis not present

## 2014-06-11 DIAGNOSIS — Z8673 Personal history of transient ischemic attack (TIA), and cerebral infarction without residual deficits: Secondary | ICD-10-CM | POA: Diagnosis not present

## 2014-06-18 DIAGNOSIS — R109 Unspecified abdominal pain: Secondary | ICD-10-CM | POA: Diagnosis not present

## 2014-06-18 DIAGNOSIS — F411 Generalized anxiety disorder: Secondary | ICD-10-CM | POA: Diagnosis not present

## 2014-06-18 DIAGNOSIS — K589 Irritable bowel syndrome without diarrhea: Secondary | ICD-10-CM | POA: Diagnosis not present

## 2014-06-22 DIAGNOSIS — H4011X3 Primary open-angle glaucoma, severe stage: Secondary | ICD-10-CM | POA: Diagnosis not present

## 2014-06-22 DIAGNOSIS — H4011X2 Primary open-angle glaucoma, moderate stage: Secondary | ICD-10-CM | POA: Diagnosis not present

## 2014-08-03 DIAGNOSIS — I10 Essential (primary) hypertension: Secondary | ICD-10-CM | POA: Diagnosis not present

## 2014-08-03 DIAGNOSIS — M1990 Unspecified osteoarthritis, unspecified site: Secondary | ICD-10-CM | POA: Diagnosis not present

## 2014-08-03 DIAGNOSIS — I5032 Chronic diastolic (congestive) heart failure: Secondary | ICD-10-CM | POA: Diagnosis not present

## 2014-08-03 DIAGNOSIS — K589 Irritable bowel syndrome without diarrhea: Secondary | ICD-10-CM | POA: Diagnosis not present

## 2014-08-03 DIAGNOSIS — E785 Hyperlipidemia, unspecified: Secondary | ICD-10-CM | POA: Diagnosis not present

## 2014-08-03 DIAGNOSIS — Z6824 Body mass index (BMI) 24.0-24.9, adult: Secondary | ICD-10-CM | POA: Diagnosis not present

## 2014-08-03 DIAGNOSIS — D539 Nutritional anemia, unspecified: Secondary | ICD-10-CM | POA: Diagnosis not present

## 2014-08-03 DIAGNOSIS — J449 Chronic obstructive pulmonary disease, unspecified: Secondary | ICD-10-CM | POA: Diagnosis not present

## 2014-09-21 DIAGNOSIS — H4011X2 Primary open-angle glaucoma, moderate stage: Secondary | ICD-10-CM | POA: Diagnosis not present

## 2014-09-21 DIAGNOSIS — H4011X3 Primary open-angle glaucoma, severe stage: Secondary | ICD-10-CM | POA: Diagnosis not present

## 2014-11-12 DIAGNOSIS — E785 Hyperlipidemia, unspecified: Secondary | ICD-10-CM | POA: Diagnosis not present

## 2014-11-12 DIAGNOSIS — I10 Essential (primary) hypertension: Secondary | ICD-10-CM | POA: Diagnosis not present

## 2014-11-12 DIAGNOSIS — J449 Chronic obstructive pulmonary disease, unspecified: Secondary | ICD-10-CM | POA: Diagnosis not present

## 2014-11-12 DIAGNOSIS — D539 Nutritional anemia, unspecified: Secondary | ICD-10-CM | POA: Diagnosis not present

## 2014-11-12 DIAGNOSIS — Z1231 Encounter for screening mammogram for malignant neoplasm of breast: Secondary | ICD-10-CM | POA: Diagnosis not present

## 2014-11-20 DIAGNOSIS — Z1231 Encounter for screening mammogram for malignant neoplasm of breast: Secondary | ICD-10-CM | POA: Diagnosis not present

## 2014-12-14 DIAGNOSIS — I10 Essential (primary) hypertension: Secondary | ICD-10-CM | POA: Diagnosis not present

## 2014-12-14 DIAGNOSIS — M1990 Unspecified osteoarthritis, unspecified site: Secondary | ICD-10-CM | POA: Diagnosis not present

## 2014-12-14 DIAGNOSIS — Z9181 History of falling: Secondary | ICD-10-CM | POA: Diagnosis not present

## 2014-12-14 DIAGNOSIS — Z6824 Body mass index (BMI) 24.0-24.9, adult: Secondary | ICD-10-CM | POA: Diagnosis not present

## 2015-01-29 DIAGNOSIS — H4011X2 Primary open-angle glaucoma, moderate stage: Secondary | ICD-10-CM | POA: Diagnosis not present

## 2015-01-29 DIAGNOSIS — H4011X3 Primary open-angle glaucoma, severe stage: Secondary | ICD-10-CM | POA: Diagnosis not present

## 2015-02-15 DIAGNOSIS — Z6824 Body mass index (BMI) 24.0-24.9, adult: Secondary | ICD-10-CM | POA: Diagnosis not present

## 2015-02-15 DIAGNOSIS — M1990 Unspecified osteoarthritis, unspecified site: Secondary | ICD-10-CM | POA: Diagnosis not present

## 2015-02-15 DIAGNOSIS — D539 Nutritional anemia, unspecified: Secondary | ICD-10-CM | POA: Diagnosis not present

## 2015-02-15 DIAGNOSIS — J449 Chronic obstructive pulmonary disease, unspecified: Secondary | ICD-10-CM | POA: Diagnosis not present

## 2015-02-15 DIAGNOSIS — I5032 Chronic diastolic (congestive) heart failure: Secondary | ICD-10-CM | POA: Diagnosis not present

## 2015-02-15 DIAGNOSIS — Z23 Encounter for immunization: Secondary | ICD-10-CM | POA: Diagnosis not present

## 2015-02-15 DIAGNOSIS — I10 Essential (primary) hypertension: Secondary | ICD-10-CM | POA: Diagnosis not present

## 2015-03-08 DIAGNOSIS — R002 Palpitations: Secondary | ICD-10-CM | POA: Diagnosis not present

## 2015-03-08 DIAGNOSIS — I2510 Atherosclerotic heart disease of native coronary artery without angina pectoris: Secondary | ICD-10-CM | POA: Diagnosis not present

## 2015-03-08 DIAGNOSIS — I10 Essential (primary) hypertension: Secondary | ICD-10-CM | POA: Diagnosis not present

## 2015-03-08 DIAGNOSIS — E785 Hyperlipidemia, unspecified: Secondary | ICD-10-CM | POA: Diagnosis not present

## 2015-05-24 DIAGNOSIS — Z6824 Body mass index (BMI) 24.0-24.9, adult: Secondary | ICD-10-CM | POA: Diagnosis not present

## 2015-05-24 DIAGNOSIS — J449 Chronic obstructive pulmonary disease, unspecified: Secondary | ICD-10-CM | POA: Diagnosis not present

## 2015-05-24 DIAGNOSIS — D539 Nutritional anemia, unspecified: Secondary | ICD-10-CM | POA: Diagnosis not present

## 2015-05-24 DIAGNOSIS — I5032 Chronic diastolic (congestive) heart failure: Secondary | ICD-10-CM | POA: Diagnosis not present

## 2015-05-24 DIAGNOSIS — E785 Hyperlipidemia, unspecified: Secondary | ICD-10-CM | POA: Diagnosis not present

## 2015-05-24 DIAGNOSIS — M1990 Unspecified osteoarthritis, unspecified site: Secondary | ICD-10-CM | POA: Diagnosis not present

## 2015-05-24 DIAGNOSIS — I10 Essential (primary) hypertension: Secondary | ICD-10-CM | POA: Diagnosis not present

## 2015-06-04 DIAGNOSIS — H401113 Primary open-angle glaucoma, right eye, severe stage: Secondary | ICD-10-CM | POA: Diagnosis not present

## 2015-06-04 DIAGNOSIS — H401122 Primary open-angle glaucoma, left eye, moderate stage: Secondary | ICD-10-CM | POA: Diagnosis not present

## 2015-06-28 DIAGNOSIS — H401122 Primary open-angle glaucoma, left eye, moderate stage: Secondary | ICD-10-CM | POA: Diagnosis not present

## 2015-06-28 DIAGNOSIS — H401113 Primary open-angle glaucoma, right eye, severe stage: Secondary | ICD-10-CM | POA: Diagnosis not present

## 2015-08-04 DIAGNOSIS — H401132 Primary open-angle glaucoma, bilateral, moderate stage: Secondary | ICD-10-CM | POA: Diagnosis not present

## 2015-08-30 DIAGNOSIS — J449 Chronic obstructive pulmonary disease, unspecified: Secondary | ICD-10-CM | POA: Diagnosis not present

## 2015-08-30 DIAGNOSIS — Z6824 Body mass index (BMI) 24.0-24.9, adult: Secondary | ICD-10-CM | POA: Diagnosis not present

## 2015-08-30 DIAGNOSIS — K580 Irritable bowel syndrome with diarrhea: Secondary | ICD-10-CM | POA: Diagnosis not present

## 2015-08-30 DIAGNOSIS — Z1389 Encounter for screening for other disorder: Secondary | ICD-10-CM | POA: Diagnosis not present

## 2015-08-30 DIAGNOSIS — M1990 Unspecified osteoarthritis, unspecified site: Secondary | ICD-10-CM | POA: Diagnosis not present

## 2015-08-30 DIAGNOSIS — I10 Essential (primary) hypertension: Secondary | ICD-10-CM | POA: Diagnosis not present

## 2015-08-30 DIAGNOSIS — D539 Nutritional anemia, unspecified: Secondary | ICD-10-CM | POA: Diagnosis not present

## 2015-08-30 DIAGNOSIS — R252 Cramp and spasm: Secondary | ICD-10-CM | POA: Diagnosis not present

## 2015-09-25 DIAGNOSIS — J441 Chronic obstructive pulmonary disease with (acute) exacerbation: Secondary | ICD-10-CM | POA: Diagnosis not present

## 2015-09-25 DIAGNOSIS — R0602 Shortness of breath: Secondary | ICD-10-CM | POA: Diagnosis not present

## 2015-09-25 DIAGNOSIS — R259 Unspecified abnormal involuntary movements: Secondary | ICD-10-CM | POA: Diagnosis not present

## 2015-09-25 DIAGNOSIS — G2589 Other specified extrapyramidal and movement disorders: Secondary | ICD-10-CM | POA: Diagnosis not present

## 2015-09-25 DIAGNOSIS — G253 Myoclonus: Secondary | ICD-10-CM | POA: Diagnosis not present

## 2015-10-04 DIAGNOSIS — H401132 Primary open-angle glaucoma, bilateral, moderate stage: Secondary | ICD-10-CM | POA: Diagnosis not present

## 2015-10-05 DIAGNOSIS — I10 Essential (primary) hypertension: Secondary | ICD-10-CM | POA: Diagnosis not present

## 2015-10-05 DIAGNOSIS — R0600 Dyspnea, unspecified: Secondary | ICD-10-CM | POA: Diagnosis not present

## 2015-10-05 DIAGNOSIS — F411 Generalized anxiety disorder: Secondary | ICD-10-CM | POA: Diagnosis not present

## 2015-10-05 DIAGNOSIS — J441 Chronic obstructive pulmonary disease with (acute) exacerbation: Secondary | ICD-10-CM | POA: Diagnosis not present

## 2015-10-05 DIAGNOSIS — Z6823 Body mass index (BMI) 23.0-23.9, adult: Secondary | ICD-10-CM | POA: Diagnosis not present

## 2015-10-05 DIAGNOSIS — I2510 Atherosclerotic heart disease of native coronary artery without angina pectoris: Secondary | ICD-10-CM | POA: Diagnosis not present

## 2015-10-25 DIAGNOSIS — I10 Essential (primary) hypertension: Secondary | ICD-10-CM | POA: Diagnosis not present

## 2015-10-25 DIAGNOSIS — E785 Hyperlipidemia, unspecified: Secondary | ICD-10-CM | POA: Diagnosis not present

## 2015-10-25 DIAGNOSIS — I2510 Atherosclerotic heart disease of native coronary artery without angina pectoris: Secondary | ICD-10-CM | POA: Diagnosis not present

## 2015-10-25 DIAGNOSIS — R002 Palpitations: Secondary | ICD-10-CM | POA: Diagnosis not present

## 2015-10-26 DIAGNOSIS — I10 Essential (primary) hypertension: Secondary | ICD-10-CM | POA: Diagnosis not present

## 2015-10-26 DIAGNOSIS — Z6824 Body mass index (BMI) 24.0-24.9, adult: Secondary | ICD-10-CM | POA: Diagnosis not present

## 2015-10-26 DIAGNOSIS — F411 Generalized anxiety disorder: Secondary | ICD-10-CM | POA: Diagnosis not present

## 2015-11-16 DIAGNOSIS — H401132 Primary open-angle glaucoma, bilateral, moderate stage: Secondary | ICD-10-CM | POA: Diagnosis not present

## 2015-11-23 ENCOUNTER — Emergency Department (HOSPITAL_COMMUNITY): Payer: Medicare Other

## 2015-11-23 ENCOUNTER — Inpatient Hospital Stay (HOSPITAL_COMMUNITY): Payer: Medicare Other

## 2015-11-23 ENCOUNTER — Inpatient Hospital Stay (HOSPITAL_COMMUNITY)
Admission: EM | Admit: 2015-11-23 | Discharge: 2015-11-29 | DRG: 086 | Disposition: A | Discharge status: Skilled Nursing Facility | Payer: Medicare Other | Attending: Internal Medicine | Admitting: Internal Medicine

## 2015-11-23 ENCOUNTER — Encounter (HOSPITAL_COMMUNITY): Payer: Self-pay | Admitting: Emergency Medicine

## 2015-11-23 DIAGNOSIS — Z7902 Long term (current) use of antithrombotics/antiplatelets: Secondary | ICD-10-CM

## 2015-11-23 DIAGNOSIS — Z955 Presence of coronary angioplasty implant and graft: Secondary | ICD-10-CM

## 2015-11-23 DIAGNOSIS — L8990 Pressure ulcer of unspecified site, unspecified stage: Secondary | ICD-10-CM | POA: Insufficient documentation

## 2015-11-23 DIAGNOSIS — F329 Major depressive disorder, single episode, unspecified: Secondary | ICD-10-CM | POA: Diagnosis not present

## 2015-11-23 DIAGNOSIS — J449 Chronic obstructive pulmonary disease, unspecified: Secondary | ICD-10-CM | POA: Diagnosis not present

## 2015-11-23 DIAGNOSIS — S52501A Unspecified fracture of the lower end of right radius, initial encounter for closed fracture: Secondary | ICD-10-CM

## 2015-11-23 DIAGNOSIS — I2510 Atherosclerotic heart disease of native coronary artery without angina pectoris: Secondary | ICD-10-CM | POA: Diagnosis present

## 2015-11-23 DIAGNOSIS — Z7982 Long term (current) use of aspirin: Secondary | ICD-10-CM

## 2015-11-23 DIAGNOSIS — W19XXXA Unspecified fall, initial encounter: Secondary | ICD-10-CM | POA: Diagnosis not present

## 2015-11-23 DIAGNOSIS — S0285XA Fracture of orbit, unspecified, initial encounter for closed fracture: Secondary | ICD-10-CM | POA: Diagnosis present

## 2015-11-23 DIAGNOSIS — S06339A Contusion and laceration of cerebrum, unspecified, with loss of consciousness of unspecified duration, initial encounter: Secondary | ICD-10-CM

## 2015-11-23 DIAGNOSIS — I10 Essential (primary) hypertension: Secondary | ICD-10-CM | POA: Diagnosis not present

## 2015-11-23 DIAGNOSIS — S52601A Unspecified fracture of lower end of right ulna, initial encounter for closed fracture: Secondary | ICD-10-CM | POA: Diagnosis not present

## 2015-11-23 DIAGNOSIS — Z79899 Other long term (current) drug therapy: Secondary | ICD-10-CM

## 2015-11-23 DIAGNOSIS — S065XAA Traumatic subdural hemorrhage with loss of consciousness status unknown, initial encounter: Secondary | ICD-10-CM | POA: Diagnosis present

## 2015-11-23 DIAGNOSIS — Q7191 Unspecified reduction defect of right upper limb: Secondary | ICD-10-CM

## 2015-11-23 DIAGNOSIS — S065X0D Traumatic subdural hemorrhage without loss of consciousness, subsequent encounter: Secondary | ICD-10-CM | POA: Diagnosis not present

## 2015-11-23 DIAGNOSIS — S52691A Other fracture of lower end of right ulna, initial encounter for closed fracture: Secondary | ICD-10-CM | POA: Diagnosis not present

## 2015-11-23 DIAGNOSIS — Z91041 Radiographic dye allergy status: Secondary | ICD-10-CM

## 2015-11-23 DIAGNOSIS — S065X0A Traumatic subdural hemorrhage without loss of consciousness, initial encounter: Secondary | ICD-10-CM | POA: Diagnosis not present

## 2015-11-23 DIAGNOSIS — I609 Nontraumatic subarachnoid hemorrhage, unspecified: Secondary | ICD-10-CM | POA: Diagnosis not present

## 2015-11-23 DIAGNOSIS — I619 Nontraumatic intracerebral hemorrhage, unspecified: Secondary | ICD-10-CM | POA: Diagnosis not present

## 2015-11-23 DIAGNOSIS — I517 Cardiomegaly: Secondary | ICD-10-CM | POA: Diagnosis present

## 2015-11-23 DIAGNOSIS — R32 Unspecified urinary incontinence: Secondary | ICD-10-CM | POA: Diagnosis present

## 2015-11-23 DIAGNOSIS — S06310A Contusion and laceration of right cerebrum without loss of consciousness, initial encounter: Secondary | ICD-10-CM | POA: Diagnosis not present

## 2015-11-23 DIAGNOSIS — S066X0A Traumatic subarachnoid hemorrhage without loss of consciousness, initial encounter: Secondary | ICD-10-CM | POA: Diagnosis not present

## 2015-11-23 DIAGNOSIS — T148 Other injury of unspecified body region: Secondary | ICD-10-CM | POA: Diagnosis not present

## 2015-11-23 DIAGNOSIS — S065X9A Traumatic subdural hemorrhage with loss of consciousness of unspecified duration, initial encounter: Secondary | ICD-10-CM | POA: Diagnosis present

## 2015-11-23 DIAGNOSIS — Z9181 History of falling: Secondary | ICD-10-CM | POA: Diagnosis not present

## 2015-11-23 DIAGNOSIS — R52 Pain, unspecified: Secondary | ICD-10-CM

## 2015-11-23 DIAGNOSIS — M810 Age-related osteoporosis without current pathological fracture: Secondary | ICD-10-CM | POA: Diagnosis present

## 2015-11-23 DIAGNOSIS — R51 Headache: Secondary | ICD-10-CM | POA: Diagnosis not present

## 2015-11-23 DIAGNOSIS — S06304A Unspecified focal traumatic brain injury with loss of consciousness of 6 hours to 24 hours, initial encounter: Secondary | ICD-10-CM | POA: Diagnosis not present

## 2015-11-23 DIAGNOSIS — S52591A Other fractures of lower end of right radius, initial encounter for closed fracture: Secondary | ICD-10-CM | POA: Diagnosis not present

## 2015-11-23 DIAGNOSIS — M62838 Other muscle spasm: Secondary | ICD-10-CM | POA: Diagnosis present

## 2015-11-23 DIAGNOSIS — W1830XA Fall on same level, unspecified, initial encounter: Secondary | ICD-10-CM | POA: Diagnosis present

## 2015-11-23 DIAGNOSIS — Z87891 Personal history of nicotine dependence: Secondary | ICD-10-CM

## 2015-11-23 DIAGNOSIS — R079 Chest pain, unspecified: Secondary | ICD-10-CM

## 2015-11-23 DIAGNOSIS — S299XXA Unspecified injury of thorax, initial encounter: Secondary | ICD-10-CM | POA: Diagnosis not present

## 2015-11-23 DIAGNOSIS — S52531A Colles' fracture of right radius, initial encounter for closed fracture: Secondary | ICD-10-CM | POA: Diagnosis present

## 2015-11-23 DIAGNOSIS — S0990XA Unspecified injury of head, initial encounter: Secondary | ICD-10-CM | POA: Diagnosis present

## 2015-11-23 DIAGNOSIS — R220 Localized swelling, mass and lump, head: Secondary | ICD-10-CM | POA: Diagnosis not present

## 2015-11-23 DIAGNOSIS — S62101A Fracture of unspecified carpal bone, right wrist, initial encounter for closed fracture: Secondary | ICD-10-CM | POA: Diagnosis not present

## 2015-11-23 DIAGNOSIS — Z959 Presence of cardiac and vascular implant and graft, unspecified: Secondary | ICD-10-CM

## 2015-11-23 DIAGNOSIS — R748 Abnormal levels of other serum enzymes: Secondary | ICD-10-CM | POA: Diagnosis present

## 2015-11-23 DIAGNOSIS — R159 Full incontinence of feces: Secondary | ICD-10-CM | POA: Diagnosis present

## 2015-11-23 DIAGNOSIS — Y92009 Unspecified place in unspecified non-institutional (private) residence as the place of occurrence of the external cause: Secondary | ICD-10-CM

## 2015-11-23 DIAGNOSIS — S5290XA Unspecified fracture of unspecified forearm, initial encounter for closed fracture: Secondary | ICD-10-CM | POA: Diagnosis not present

## 2015-11-23 DIAGNOSIS — S06330A Contusion and laceration of cerebrum, unspecified, without loss of consciousness, initial encounter: Secondary | ICD-10-CM | POA: Diagnosis not present

## 2015-11-23 DIAGNOSIS — I6200 Nontraumatic subdural hemorrhage, unspecified: Secondary | ICD-10-CM | POA: Diagnosis not present

## 2015-11-23 DIAGNOSIS — S199XXA Unspecified injury of neck, initial encounter: Secondary | ICD-10-CM | POA: Diagnosis not present

## 2015-11-23 DIAGNOSIS — R05 Cough: Secondary | ICD-10-CM | POA: Diagnosis present

## 2015-11-23 DIAGNOSIS — M25552 Pain in left hip: Secondary | ICD-10-CM | POA: Diagnosis not present

## 2015-11-23 DIAGNOSIS — S0993XA Unspecified injury of face, initial encounter: Secondary | ICD-10-CM | POA: Diagnosis not present

## 2015-11-23 DIAGNOSIS — S0633AA Contusion and laceration of cerebrum, unspecified, with loss of consciousness status unknown, initial encounter: Secondary | ICD-10-CM

## 2015-11-23 DIAGNOSIS — Z95818 Presence of other cardiac implants and grafts: Secondary | ICD-10-CM | POA: Diagnosis not present

## 2015-11-23 DIAGNOSIS — R609 Edema, unspecified: Secondary | ICD-10-CM | POA: Diagnosis not present

## 2015-11-23 DIAGNOSIS — R0902 Hypoxemia: Secondary | ICD-10-CM

## 2015-11-23 DIAGNOSIS — I440 Atrioventricular block, first degree: Secondary | ICD-10-CM | POA: Diagnosis present

## 2015-11-23 DIAGNOSIS — R296 Repeated falls: Secondary | ICD-10-CM | POA: Diagnosis not present

## 2015-11-23 DIAGNOSIS — M47812 Spondylosis without myelopathy or radiculopathy, cervical region: Secondary | ICD-10-CM | POA: Diagnosis present

## 2015-11-23 DIAGNOSIS — R0789 Other chest pain: Secondary | ICD-10-CM | POA: Diagnosis not present

## 2015-11-23 DIAGNOSIS — R58 Hemorrhage, not elsewhere classified: Secondary | ICD-10-CM | POA: Diagnosis not present

## 2015-11-23 DIAGNOSIS — T148XXA Other injury of unspecified body region, initial encounter: Secondary | ICD-10-CM

## 2015-11-23 DIAGNOSIS — S52531D Colles' fracture of right radius, subsequent encounter for closed fracture with routine healing: Secondary | ICD-10-CM | POA: Diagnosis not present

## 2015-11-23 HISTORY — DX: Essential (primary) hypertension: I10

## 2015-11-23 LAB — BASIC METABOLIC PANEL
Anion gap: 7 (ref 5–15)
BUN: 19 mg/dL (ref 6–20)
CO2: 23 mmol/L (ref 22–32)
CREATININE: 0.87 mg/dL (ref 0.44–1.00)
Calcium: 8.6 mg/dL — ABNORMAL LOW (ref 8.9–10.3)
Chloride: 107 mmol/L (ref 101–111)
GFR calc Af Amer: >60 mL/min (ref >60)
GFR, EST NON AFRICAN AMERICAN: 57 mL/min — AB (ref >60)
GLUCOSE: 108 mg/dL — AB (ref 65–99)
POTASSIUM: 3.9 mmol/L (ref 3.5–5.1)
Sodium: 137 mmol/L (ref 135–145)

## 2015-11-23 LAB — TROPONIN I
TROPONIN I: 0.18 ng/mL — AB (ref <0.03)
TROPONIN I: 0.1 ng/mL — AB (ref <0.03)
Troponin I: 0.03 ng/mL (ref <0.03)

## 2015-11-23 LAB — URINALYSIS, ROUTINE W REFLEX MICROSCOPIC
BILIRUBIN URINE: NEGATIVE
GLUCOSE, UA: NEGATIVE mg/dL
Hgb urine dipstick: NEGATIVE
KETONES UR: NEGATIVE mg/dL
Nitrite: NEGATIVE
PH: 6 (ref 5.0–8.0)
Protein, ur: NEGATIVE mg/dL
Specific Gravity, Urine: 1.012 (ref 1.005–1.030)

## 2015-11-23 LAB — GLUCOSE, CAPILLARY: Glucose-Capillary: 82 mg/dL (ref 65–99)

## 2015-11-23 LAB — CBC
HEMATOCRIT: 36 % (ref 36.0–46.0)
Hemoglobin: 11.2 g/dL — ABNORMAL LOW (ref 12.0–15.0)
MCH: 29.4 pg (ref 26.0–34.0)
MCHC: 31.1 g/dL (ref 30.0–36.0)
MCV: 94.5 fL (ref 78.0–100.0)
Platelets: 140 10*3/uL — ABNORMAL LOW (ref 150–400)
RBC: 3.81 MIL/uL — ABNORMAL LOW (ref 3.87–5.11)
RDW: 13.5 % (ref 11.5–15.5)
WBC: 8.2 10*3/uL (ref 4.0–10.5)

## 2015-11-23 LAB — URINE MICROSCOPIC-ADD ON

## 2015-11-23 LAB — CBG MONITORING, ED: GLUCOSE-CAPILLARY: 116 mg/dL — AB (ref 65–99)

## 2015-11-23 LAB — MRSA PCR SCREENING: MRSA by PCR: NEGATIVE

## 2015-11-23 MED ORDER — ONDANSETRON HCL 4 MG PO TABS: 4.0000 mg | ORAL_TABLET | Freq: Four times a day (QID) | ORAL | Status: DC | PRN

## 2015-11-23 MED ORDER — MORPHINE SULFATE (PF) 4 MG/ML IV SOLN
4.0000 mg | Freq: Once | INTRAVENOUS | Status: AC
Start: 2015-11-23 — End: 2015-11-23
  Administered 2015-11-23: 4 mg via INTRAVENOUS
  Filled 2015-11-23: qty 1

## 2015-11-23 MED ORDER — NITROGLYCERIN 0.4 MG SL SUBL: 0.4000 mg | SUBLINGUAL_TABLET | SUBLINGUAL | Status: DC | PRN

## 2015-11-23 MED ORDER — ATORVASTATIN CALCIUM 80 MG PO TABS
80.0000 mg | ORAL_TABLET | Freq: Every day | ORAL | Status: DC
Start: 2015-11-23 — End: 2015-11-29
  Administered 2015-11-23 – 2015-11-28 (×6): 80 mg via ORAL
  Filled 2015-11-23 (×6): qty 1

## 2015-11-23 MED ORDER — ONDANSETRON HCL 4 MG/2ML IJ SOLN
4.0000 mg | Freq: Once | INTRAMUSCULAR | Status: AC
Start: 2015-11-23 — End: 2015-11-23
  Administered 2015-11-23: 4 mg via INTRAVENOUS
  Filled 2015-11-23: qty 2

## 2015-11-23 MED ORDER — SODIUM CHLORIDE 0.9 % IV SOLN
INTRAVENOUS | Status: AC
  Administered 2015-11-23: 14:00:00 via INTRAVENOUS

## 2015-11-23 MED ORDER — MORPHINE SULFATE (PF) 4 MG/ML IV SOLN
4.0000 mg | Freq: Once | INTRAVENOUS | Status: AC
  Administered 2015-11-23: 4 mg via INTRAVENOUS
  Filled 2015-11-23: qty 1

## 2015-11-23 MED ORDER — LATANOPROST 0.005 % OP SOLN
1.0000 [drp] | Freq: Every day | OPHTHALMIC | Status: DC
  Administered 2015-11-23 – 2015-11-28 (×5): 1 [drp] via OPHTHALMIC
  Filled 2015-11-23 (×2): qty 2.5

## 2015-11-23 MED ORDER — ACETAMINOPHEN 325 MG PO TABS
650.0000 mg | ORAL_TABLET | Freq: Four times a day (QID) | ORAL | Status: DC | PRN
  Administered 2015-11-25 – 2015-11-28 (×6): 650 mg via ORAL
  Filled 2015-11-23 (×5): qty 2

## 2015-11-23 MED ORDER — POLYETHYLENE GLYCOL 3350 17 G PO PACK: 17.0000 g | PACK | Freq: Every day | ORAL | Status: DC | PRN

## 2015-11-23 MED ORDER — TETANUS-DIPHTH-ACELL PERTUSSIS 5-2.5-18.5 LF-MCG/0.5 IM SUSP
0.5000 mL | Freq: Once | INTRAMUSCULAR | Status: AC
  Administered 2015-11-23: 0.5 mL via INTRAMUSCULAR
  Filled 2015-11-23: qty 0.5

## 2015-11-23 MED ORDER — MORPHINE SULFATE (PF) 2 MG/ML IV SOLN
2.0000 mg | INTRAVENOUS | Status: DC | PRN
  Administered 2015-11-23 – 2015-11-24 (×4): 2 mg via INTRAVENOUS
  Filled 2015-11-23 (×4): qty 1

## 2015-11-23 MED ORDER — ONDANSETRON HCL 4 MG/2ML IJ SOLN
4.0000 mg | Freq: Once | INTRAMUSCULAR | Status: AC
  Administered 2015-11-23: 4 mg via INTRAVENOUS
  Filled 2015-11-23: qty 2

## 2015-11-23 MED ORDER — ONDANSETRON HCL 4 MG/2ML IJ SOLN: 4.0000 mg | Freq: Four times a day (QID) | INTRAMUSCULAR | Status: DC | PRN

## 2015-11-23 MED ORDER — CITALOPRAM HYDROBROMIDE 10 MG PO TABS
10.0000 mg | ORAL_TABLET | Freq: Every day | ORAL | Status: DC
  Administered 2015-11-23 – 2015-11-29 (×7): 10 mg via ORAL
  Filled 2015-11-23 (×7): qty 1

## 2015-11-23 MED ORDER — SODIUM CHLORIDE 0.9% FLUSH
3.0000 mL | Freq: Two times a day (BID) | INTRAVENOUS | Status: DC
  Administered 2015-11-24 – 2015-11-29 (×11): 3 mL via INTRAVENOUS

## 2015-11-23 MED ORDER — ACETAMINOPHEN 650 MG RE SUPP: 650.0000 mg | Freq: Four times a day (QID) | RECTAL | Status: DC | PRN

## 2015-11-23 MED ORDER — FENTANYL CITRATE (PF) 100 MCG/2ML IJ SOLN
50.0000 ug | INTRAMUSCULAR | Status: AC | PRN
  Administered 2015-11-23 – 2015-11-24 (×2): 50 ug via INTRAVENOUS
  Filled 2015-11-23 (×2): qty 2

## 2015-11-23 MED ORDER — ONDANSETRON HCL 4 MG/2ML IJ SOLN: 4.0000 mg | Freq: Three times a day (TID) | INTRAMUSCULAR | Status: DC | PRN

## 2015-11-23 NOTE — ED Notes (Signed)
Ortho at bedside for splinting.

## 2015-11-23 NOTE — ED Notes (Signed)
Spoke with EDP, asking to see pt ASAP

## 2015-11-23 NOTE — ED Notes (Addendum)
Lab notified of Add-On troponin

## 2015-11-23 NOTE — ED Notes (Signed)
Ortho Paged.  

## 2015-11-23 NOTE — ED Notes (Signed)
EDP notified of trop and request for pain med.

## 2015-11-23 NOTE — H&P (Signed)
Date: 11/23/2015               Patient Name:  Elizabeth Barry MRN: [id number]  DOB: Jun 23, 1922 Age / Sex: 82 y.o., female   PCP: Hurshel Party, NP         Medical Service: Internal Medicine Teaching Service         Attending Physician: Dr. Doneen Poisson, MD    First Contact: Dr. Nelson Chimes Pager: [phone]  Second Contact: Dr. Beckie Salts Pager: [phone]       After Hours (After 5p/  First Contact Pager: [phone]  weekends / holidays): Second Contact Pager: [phone]   Chief Complaint: fall  History of Present Illness: Elizabeth Barry is a 82 yo female with PMHx of HTN, CAD s/p stent placement x 2, history of falls with history of shoulder injury and hip fracture in the last 5 years who presents after a fall. Patient lives on her own in [place]. This morning around 430 am, patient awoke from sleep feeling cold. She ambulated to the other room with her walker where the thermostat was. When in the other room, the patient fell to the floor hitting her head. She cannot remember how the fall happened or if she tripped. She denies any symptoms such as lightheadedness, confusion, weakness, chest pain, shortness of breath or heart palpitations prior to the fall. She was in her normal state of healthy yesterday and denied any recent fever, chills, cough or dysuria. Patient was able to press her life alert button to call for EMS.   Upon arrival in the ED, patient was afebrile at 97, normotensive at 140/68, HR 70, RR 20, satting 97% on room air. CBC showed WBC of 8.2 and hgb of 11.2 (no priors). BMET was within normal limits. Troponin 0.03. UA was negative for infection. EKG showed SR with a 1st degree AV block, but no ST or TW changes indicative of ischemia. CXR showed mild Cardiomegaly and no acute disease. Xray or right wrist revealed a posteriorly displaced and overlapping distal radial and ulnar fractures. CT Head/maxillofacial/cervical spine showed a right frontal intra-cerebral hemorrhage with overlying subarachnoid  and subdural hemorrhage on the right. Thickness of subdural bleed 5 mm. No significant mass effect or midline shift. Right facial/orbital soft tissue swelling, no fracture.  While in CT, patient complained of transient dull, achey mid-chest pain that lasted for a few minutes. It was not associated with shortness of breath, diaphoresis, lightheadedness. She is unsure what made the pain better or worse. She no longer has chest pain. She was given one nitroglycerin.  Currently, patient complains of pain over right eye, forehead and right wrist. She denies pain elsewhere. She admits to nausea and one episode of vomiting. She denies confusion.   Meds: Current Facility-Administered Medications  Medication Dose Route Frequency Provider Last Rate Last Dose  . fentaNYL (SUBLIMAZE) injection 50 mcg  50 mcg Intravenous Q1H PRN Raeford Razor, MD      . nitroGLYCERIN (NITROSTAT) SL tablet 0.4 mg  0.4 mg Sublingual Q5 min PRN Dione Booze, MD      . ondansetron [facility]) injection 4 mg  4 mg Intravenous Q8H PRN Raeford Razor, MD       Current Outpatient Prescriptions  Medication Sig Dispense Refill  . atorvastatin (LIPITOR) 80 MG tablet Take 80 mg by mouth daily.    . bimatoprost (LUMIGAN) 0.01 % SOLN Place 1 drop into both eyes at bedtime.    . Brinzolamide-Brimonidine (SIMBRINZA) 1-0.2 % SUSP Place 1 drop  into both eyes 2 (two) times daily.    . citalopram (CELEXA) 10 MG tablet Take 10 mg by mouth daily.    . clopidogrel (PLAVIX) 75 MG tablet Take 75 mg by mouth daily.    . furosemide (LASIX) 20 MG tablet Take 20 mg by mouth daily.    . hydrALAZINE (APRESOLINE) 25 MG tablet Take 25-50 mg by mouth 3 (three) times daily.  in the morning,  in the afternoon and  in the evening    . lisinopril (PRINIVIL,ZESTRIL) 10 MG tablet Take 10 mg by mouth 2 (two) times daily.    . metoprolol tartrate (LOPRESSOR) 25 MG tablet Take 25 mg by mouth 2 (two) times daily.      Allergies: Allergies as of 11/23/2015  - Review Complete 11/23/2015  Allergen Reaction Noted  . Contrast media [iodinated diagnostic agents] Nausea And Vomiting 11/23/2015   Past Medical History:  Diagnosis Date  . Hypertension     Family History: Mother- Breast Cancer Father- CAD  Social History:  Tobacco Use: Previous smoker, quit in 1996. 40 pack year history Alcohol Use: Denies Illicit Drug Use: Denies  Review of Systems: A complete ROS was negative except as per HPI.   Physical Exam: Vitals:   11/23/15 0726 11/23/15 0730 11/23/15 0800 11/23/15 0815  BP: 123/58 (!) 123/51 (!) 136/45 (!) 133/37  Pulse: 65 (!) 58 (!) 50 (!) 54  Resp: Temp:      TempSrc:      SpO2: 93% 94% 99% 100%  Weight:      Height:       General: Vital signs reviewed.  Patient is elderly, in no acute distress and cooperative with exam.  Head: Large right sided ecchymosis with hematoma over right forehead extending over right orbit to right side of mouth. Eyes: Right orbit edematous with large ecchymosis, patient is unable to open. Laceration below right orbit. Left PERRLA with EOMI intact. Mouth: Teeth are intact. Edema and ecchymosis over right side of lip. Normal tongue. Neck: Supple, trachea midline.  Cardiovascular: RRR, 3/6 blowing systolic murmur. Pulmonary/Chest: Clear to auscultation bilaterally, no wheezes, rales, or rhonchi. Abdominal: Soft, non-tender, non-distended, BS +, no guarding present.  Extremities: No lower extremity edema bilaterally, pulses symmetric and intact bilaterally. Right wrist deformity with ecchymosis. Normal right and left radial pulses. Normal sensation. Able to wiggle fingers. Neurological: A&O x3, cranial nerve II-XII are grossly intact, sensory intact to light touch bilaterally.  Skin: Ecchymoses as described above. Psychiatric: Normal mood and affect. speech and behavior is normal. Cognition and memory are grossly normal.   EKG showed SR with a 1st degree AV block, but no ST or TW  changes indicative of ischemia.   CXR showed mild Cardiomegaly and no acute disease.   Xray of right wrist revealed a posteriorly displaced and overlapping distal radial and ulnar fractures.   CT Head/maxillofacial/cervical spine showed a right frontal intra-cerebral hemorrhage with overlying subarachnoid and subdural hemorrhage on the right. Thickness of subdural bleed 5 mm. No significant mass effect or midline shift. Right facial/orbital soft tissue swelling, no fracture.  Assessment & Plan by Problem: Principal Problem:   Subdural hematoma (HCC) Active Problems:   Cerebral hemorrhage (HCC)   Subarachnoid hemorrhage (HCC)   HTN (hypertension)   CAD (coronary artery disease)   S/P arterial stent   Radial fracture   Right distal ulnar fracture   Elizabeth Barry is a 82 yo female with PMHx of HTN, CAD s/p stent placement  x 2, and history of falls who presents after a fall.  Cerebral Hemorrhage with Subdural Hematoma and Subarachnoid Hemorrhage: Secondary to fall at home. CT head/maxillofacial/cervical spine showed a right frontal intra-cerebral hemorrhage with overlying subarachnoid and subdural hemorrhage on the right. Thickness of subdural bleed 5 mm. No significant mass effect or midline shift. Right facial/orbital soft tissue swelling, no orbital fracture. Dr. Jordan Likes with Neurosurgery has been consulted. Patient is awake, alert and oriented x 3. No evidence of neurological deficits on exam. Patient was on plavix 75 mg at home with her last dose on 7/24 at 0600.  -Admit to SDU -Neurosurgery following, follow up recommendations -Holding Plavix and all anticoagulation -Monitor BP- currently normotensive, goal SBP 160/90 -PT/OT -NPO for now -NS 75 cc/hr x 12 hours -Morphine 2 mg IV Q3H prn pain -Zofran 4 mg Q6H prn nausea  Displaced Right Radial and Ulnar Wrist Fracture: Xray of right wrist revealed posteriorly displaced and overlapping distal radial and ulnar fractures. Neurovascularly  intact. Orthopedic surgery has been consulted.  -Orthopedic surgery following, follow up recommendations -NPO for now, resume diet once surgery timeline has been planned -NS 75 cc/hr x 12 hours -Morphine 2 mg IV Q3H prn pain -Zofran 4 mg Q6H prn nausea -PT/OT  Chest Pain: Transient mid-sternal dull achey chest pain after arrival to ED which resolved. Patient was given nitroglycerin. Patient does have a history of CAD, but chest pain is likely musculoskeletal in nature after fall. Troponin 0.03, no evidence of ischemic changes on EKG. No evidence of rib fracture on CXR. -Telemetry -Repeat EKG tomorrow am -Nitro prn -No ASA/Plavix due to cerebral hemorrhage -Trend troponins  CAD s/p Stent Placement: Patient follows with Dr. Kirtland Bouchard" in [place]. She is on plavix 75 mg daily, metoprolol 25 mg BID, atorvastatin 80 mg daily, lisinopril 10 mg daily at home. Stent placement was greater than 3 years ago per patient. -Holding plavix/ASA due to bleed -Continue atorvastatin 80 mg daily -Holding anti-hypertensive for now  History of Falls: This patient's 3rd fall in the last 3 years with prior hip fracture and shoulder injury. Patient lives by herself at home and uses lifealert. She ambulates with a walker and can complete all of her ADLs independently. Son lives 0.5 miles away. Would consider further discussion with patient and family about safety at home and options for assisted living.  -PT/OT  HTN: Normotensive. Target BP 160/90. Patient is on lisinopril 10 mg daily, Lasix 20 mg daily, Metoprolol 25 mg BID and Hydralazine 25 mg TID at home.  -Holding for now  -Goal of SBP 160/90 -Add back antihypertensive as needed  Depression: Patient is on citalopram 10 mg daily at home.  -Continue Citalopram  DVT/PE ppx: SCDs FEN: NPO for now, NS 75 cc/hr for 12 hours CODE: Full  Dispo: Admit patient to Inpatient with expected length of stay greater than 2 midnights.  Signed: Karlene Lineman, DO PGY-3  Internal Medicine Resident Pager # [phone] 11/23/2015 10:16 AM

## 2015-11-23 NOTE — ED Notes (Signed)
Face and Wound being cleaned by NT at bedside.

## 2015-11-23 NOTE — ED Notes (Signed)
XRAY not completed. Clicked off in error.

## 2015-11-23 NOTE — Consult Note (Signed)
Reason for Consult: Traumatic brain injury with bifrontal contusion Referring Physician: Medicine  Elizabeth Barry is an 82 y.o. female.  HPI: 82 year old female status post fall from standing height today. Mechanism unknown, mechanical versus syncopal fall. Patient awake aware and appropriate upon evaluation in emergency department and throughout her hospitalization so far. Patient with extensive facial bruising and swelling. No complaints of numbness, paresthesias or weakness. No history of seizure. No history of hemodynamic instability. No history of hypoxia.  Past Medical History:  Diagnosis Date  . Hypertension     History reviewed. No pertinent surgical history.  No family history on file.  Social History:  has no tobacco, alcohol, and drug history on file.  Allergies:  Allergies  Allergen Reactions  . Contrast Media [Iodinated Diagnostic Agents] Nausea And Vomiting    Medications: I have reviewed the patient's current medications.  Results for orders placed or performed during the hospital encounter of 11/23/15 (from the past 48 hour(s))  CBG monitoring, ED     Status: Abnormal   Collection Time: 11/23/15  5:59 AM  Result Value Ref Range   Glucose-Capillary 116 (H) 65 - 99 mg/dL  Urinalysis, Routine w reflex microscopic     Status: Abnormal   Collection Time: 11/23/15  6:07 AM  Result Value Ref Range   Color, Urine YELLOW YELLOW   APPearance CLOUDY (A) CLEAR   Specific Gravity, Urine 1.012 1.005 - 1.030   pH 6.0 5.0 - 8.0   Glucose, UA NEGATIVE NEGATIVE mg/dL   Hgb urine dipstick NEGATIVE NEGATIVE   Bilirubin Urine NEGATIVE NEGATIVE   Ketones, ur NEGATIVE NEGATIVE mg/dL   Protein, ur NEGATIVE NEGATIVE mg/dL   Nitrite NEGATIVE NEGATIVE   Leukocytes, UA TRACE (A) NEGATIVE  Urine microscopic-add on     Status: Abnormal   Collection Time: 11/23/15  6:07 AM  Result Value Ref Range   Squamous Epithelial / LPF 0-5 (A) NONE SEEN   WBC, UA 0-5 0 - 5 WBC/hpf   RBC / HPF 0-5  0 - 5 RBC/hpf   Bacteria, UA FEW (A) NONE SEEN  Basic metabolic panel     Status: Abnormal   Collection Time: 11/23/15  6:11 AM  Result Value Ref Range   Sodium 137 135 - 145 mmol/L   Potassium 3.9 3.5 - 5.1 mmol/L   Chloride 107 101 - 111 mmol/L   CO2 23 22 - 32 mmol/L   Glucose, Bld 108 (H) 65 - 99 mg/dL   BUN 19 6 - 20 mg/dL   Creatinine, Ser 0.87 0.44 - 1.00 mg/dL   Calcium 8.6 (L) 8.9 - 10.3 mg/dL   GFR calc non Af Amer 57 (L) >60 mL/min   GFR calc Af Amer >60 >60 mL/min    Comment: (NOTE) The eGFR has been calculated using the CKD EPI equation. This calculation has not been validated in all clinical situations. eGFR's persistently <60 mL/min signify possible Chronic Kidney Disease.    Anion gap 7 5 - 15  CBC     Status: Abnormal   Collection Time: 11/23/15  6:11 AM  Result Value Ref Range   WBC 8.2 4.0 - 10.5 K/uL   RBC 3.81 (L) 3.87 - 5.11 MIL/uL   Hemoglobin 11.2 (L) 12.0 - 15.0 g/dL   HCT 36.0 36.0 - 46.0 %   MCV 94.5 78.0 - 100.0 fL   MCH 29.4 26.0 - 34.0 pg   MCHC 31.1 30.0 - 36.0 g/dL   RDW 13.5 11.5 - 15.5 %  Platelets 140 (L) 150 - 400 K/uL  Troponin I     Status: Abnormal   Collection Time: 11/23/15  6:11 AM  Result Value Ref Range   Troponin I 0.03 (HH) <0.03 ng/mL    Comment: CRITICAL RESULT CALLED TO, READ BACK BY AND VERIFIED WITH: B.LEAK,RN 11/23/15 0804 BY BSLADE   MRSA PCR Screening     Status: None   Collection Time: 11/23/15  1:41 PM  Result Value Ref Range   MRSA by PCR NEGATIVE NEGATIVE    Comment:        The GeneXpert MRSA Assay (FDA approved for NASAL specimens only), is one component of a comprehensive MRSA colonization surveillance program. It is not intended to diagnose MRSA infection nor to guide or monitor treatment for MRSA infections.   Troponin I     Status: Abnormal   Collection Time: 11/23/15  2:37 PM  Result Value Ref Range   Troponin I 0.18 (HH) <0.03 ng/mL    Comment: CRITICAL VALUE NOTED.  VALUE IS CONSISTENT WITH  PREVIOUSLY REPORTED AND CALLED VALUE.    Dg Wrist Complete Right  Result Date: 11/23/2015 CLINICAL DATA:  Patient had unwitnessed fall this morning. Pain and deformity to right wrist. EXAM: RIGHT WRIST - COMPLETE 3+ VIEW COMPARISON:  None. FINDINGS: Displaced fractures are noted through the distal radius and ulna. Distal fragments are displaced posteriorly. Mild overlapping fracture fragments within the distal radius. IMPRESSION: Posteriorly displaced and overlapping distal radial and ulnar fractures. Electronically Signed   By: Rolm Baptise M.D.   On: 11/23/2015 08:28  Ct Head Wo Contrast  Result Date: 11/23/2015 CLINICAL DATA:  Unwitnessed fall. Right facial swelling and bruising to right eye. EXAM: CT HEAD WITHOUT CONTRAST CT MAXILLOFACIAL WITHOUT CONTRAST CT CERVICAL SPINE WITHOUT CONTRAST TECHNIQUE: Multidetector CT imaging of the head, cervical spine, and maxillofacial structures were performed using the standard protocol without intravenous contrast. Multiplanar CT image reconstructions of the cervical spine and maxillofacial structures were also generated. COMPARISON:  None. FINDINGS: CT HEAD FINDINGS Intra cerebral hemorrhage is noted within the right frontal lobe. There likely is a small amount of subarachnoid blood overlying the right frontal lobe as well. Small subdural hemorrhages noted over the right temporal lobe measuring approximately 5 mm in thickness. No midline shift. No hydrocephalus. No acute calvarial abnormality. CT MAXILLOFACIAL FINDINGS No evidence of facial or orbital fracture. Orbital soft tissues are unremarkable. Paranasal sinuses are clear. Soft tissue swelling over the right face and orbit. CT CERVICAL SPINE FINDINGS Degenerative disc and facet disease throughout the cervical spine. Slight anterolisthesis of C4 on C5 related to facet disease. Prevertebral soft tissues are normal. No fracture. IMPRESSION: Right frontal intra cerebral hemorrhage with overlying subarachnoid and  subdural hemorrhage on the right. Thickness of subdural bleed 5 mm. No significant mass effect or midline shift. Right facial/orbital soft tissue swelling.  No underlying fracture. Cervical spondylosis.  No acute bony abnormality. Electronically Signed   By: Rolm Baptise M.D.   On: 11/23/2015 09:35  Ct Cervical Spine Wo Contrast  Result Date: 11/23/2015 CLINICAL DATA:  Unwitnessed fall. Right facial swelling and bruising to right eye. EXAM: CT HEAD WITHOUT CONTRAST CT MAXILLOFACIAL WITHOUT CONTRAST CT CERVICAL SPINE WITHOUT CONTRAST TECHNIQUE: Multidetector CT imaging of the head, cervical spine, and maxillofacial structures were performed using the standard protocol without intravenous contrast. Multiplanar CT image reconstructions of the cervical spine and maxillofacial structures were also generated. COMPARISON:  None. FINDINGS: CT HEAD FINDINGS Intra cerebral hemorrhage is noted within the right  frontal lobe. There likely is a small amount of subarachnoid blood overlying the right frontal lobe as well. Small subdural hemorrhages noted over the right temporal lobe measuring approximately 5 mm in thickness. No midline shift. No hydrocephalus. No acute calvarial abnormality. CT MAXILLOFACIAL FINDINGS No evidence of facial or orbital fracture. Orbital soft tissues are unremarkable. Paranasal sinuses are clear. Soft tissue swelling over the right face and orbit. CT CERVICAL SPINE FINDINGS Degenerative disc and facet disease throughout the cervical spine. Slight anterolisthesis of C4 on C5 related to facet disease. Prevertebral soft tissues are normal. No fracture. IMPRESSION: Right frontal intra cerebral hemorrhage with overlying subarachnoid and subdural hemorrhage on the right. Thickness of subdural bleed 5 mm. No significant mass effect or midline shift. Right facial/orbital soft tissue swelling.  No underlying fracture. Cervical spondylosis.  No acute bony abnormality. Electronically Signed   By: Rolm Baptise  M.D.   On: 11/23/2015 09:35  Dg Chest Port 1 View  Result Date: 11/23/2015 CLINICAL DATA:  Unwitnessed fall at home this morning. Right facial injuries and right wrist pain. EXAM: PORTABLE CHEST 1 VIEW COMPARISON:  None. FINDINGS: Heart is mildly enlarged. No confluent airspace opacities or effusions. No acute bony abnormality. IMPRESSION: Mild cardiomegaly.  No active disease. Electronically Signed   By: Rolm Baptise M.D.   On: 11/23/2015 08:18  Ct Maxillofacial Wo Contrast  Result Date: 11/23/2015 CLINICAL DATA:  Unwitnessed fall. Right facial swelling and bruising to right eye. EXAM: CT HEAD WITHOUT CONTRAST CT MAXILLOFACIAL WITHOUT CONTRAST CT CERVICAL SPINE WITHOUT CONTRAST TECHNIQUE: Multidetector CT imaging of the head, cervical spine, and maxillofacial structures were performed using the standard protocol without intravenous contrast. Multiplanar CT image reconstructions of the cervical spine and maxillofacial structures were also generated. COMPARISON:  None. FINDINGS: CT HEAD FINDINGS Intra cerebral hemorrhage is noted within the right frontal lobe. There likely is a small amount of subarachnoid blood overlying the right frontal lobe as well. Small subdural hemorrhages noted over the right temporal lobe measuring approximately 5 mm in thickness. No midline shift. No hydrocephalus. No acute calvarial abnormality. CT MAXILLOFACIAL FINDINGS No evidence of facial or orbital fracture. Orbital soft tissues are unremarkable. Paranasal sinuses are clear. Soft tissue swelling over the right face and orbit. CT CERVICAL SPINE FINDINGS Degenerative disc and facet disease throughout the cervical spine. Slight anterolisthesis of C4 on C5 related to facet disease. Prevertebral soft tissues are normal. No fracture. IMPRESSION: Right frontal intra cerebral hemorrhage with overlying subarachnoid and subdural hemorrhage on the right. Thickness of subdural bleed 5 mm. No significant mass effect or midline shift. Right  facial/orbital soft tissue swelling.  No underlying fracture. Cervical spondylosis.  No acute bony abnormality. Electronically Signed   By: Rolm Baptise M.D.   On: 11/23/2015 09:35    Blood pressure (!) 129/39, pulse (!) 58, temperature 98.8 F (37.1 C), temperature source Oral, resp. rate 18, height 5' (1.524 m), weight 49.4 kg (109 lb), SpO2 100 %. The patient is awake and alert. She is oriented and appropriate. Her speech is fluent. Motor examination appears intact bilaterally although patient in wrist splint. Sensory examination nonfocal. Examination head ears eyes nose and throat demonstrates extensive facial bruising and swelling right greater than left. No evidence of bony abnormality. Cervical spine nontender. Airway midline. Chest and abdomen appear benign.  Assessment/Plan: Status post fall with resultant bilateral frontal contusions right greater than left. Situation complicated by Plavix usage. At this point no indication for any operative intervention. Given the patient's advanced age  and antiplatelet agent use I do worry these contusions will worsen over time. I certainly would be very hesitant to consider any type of surgery should they get worse however. At this point I think observation and supportive care is indicated.  Lavonte Palos A 11/23/2015, 5:29 PM

## 2015-11-23 NOTE — ED Notes (Signed)
Admitting MD at bedside.

## 2015-11-23 NOTE — ED Provider Notes (Signed)
Discussed with Dr Ophelia Charter, ortho/hand. Will evaluate patient later today.    Raeford Razor, MD 11/23/15 1048

## 2015-11-23 NOTE — Progress Notes (Signed)
Orthopedic Tech Progress Note Patient Details:  Elizabeth Barry January 29, 1923 [id number]  Ortho Devices Type of Ortho Device: Ace wrap, Short arm splint Ortho Device/Splint Interventions: Application   Saul Fordyce 11/23/2015, 12:06 PM

## 2015-11-23 NOTE — Consult Note (Signed)
Reason for Consult:left closed distal radius/ulna fracture Referring Physician: Eppie Gibson MD  Elizabeth Barry is an 82 y.o. female.  HPI: Hx as per chart, fell going to thermastat , broke wrist and had CHI with Subdural hemmorhage.  No past Hx of right wrist injury. In sugartong splint  Past Medical History:  Diagnosis Date  . Hypertension     History reviewed. No pertinent surgical history.  No family history on file.  Social History:  has no tobacco, alcohol, and drug history on file.  Allergies:  Allergies  Allergen Reactions  . Contrast Media [Iodinated Diagnostic Agents] Nausea And Vomiting    Medications: I have reviewed the patient's current medications.  Results for orders placed or performed during the hospital encounter of 11/23/15 (from the past 48 hour(s))  CBG monitoring, ED     Status: Abnormal   Collection Time: 11/23/15  5:59 AM  Result Value Ref Range   Glucose-Capillary 116 (H) 65 - 99 mg/dL  Urinalysis, Routine w reflex microscopic     Status: Abnormal   Collection Time: 11/23/15  6:07 AM  Result Value Ref Range   Color, Urine YELLOW YELLOW   APPearance CLOUDY (A) CLEAR   Specific Gravity, Urine 1.012 1.005 - 1.030   pH 6.0 5.0 - 8.0   Glucose, UA NEGATIVE NEGATIVE mg/dL   Hgb urine dipstick NEGATIVE NEGATIVE   Bilirubin Urine NEGATIVE NEGATIVE   Ketones, ur NEGATIVE NEGATIVE mg/dL   Protein, ur NEGATIVE NEGATIVE mg/dL   Nitrite NEGATIVE NEGATIVE   Leukocytes, UA TRACE (A) NEGATIVE  Urine microscopic-add on     Status: Abnormal   Collection Time: 11/23/15  6:07 AM  Result Value Ref Range   Squamous Epithelial / LPF 0-5 (A) NONE SEEN   WBC, UA 0-5 0 - 5 WBC/hpf   RBC / HPF 0-5 0 - 5 RBC/hpf   Bacteria, UA FEW (A) NONE SEEN  Basic metabolic panel     Status: Abnormal   Collection Time: 11/23/15  6:11 AM  Result Value Ref Range   Sodium 137 135 - 145 mmol/L   Potassium 3.9 3.5 - 5.1 mmol/L   Chloride 107 101 - 111 mmol/L   CO2 23 22 - 32 mmol/L    Glucose, Bld 108 (H) 65 - 99 mg/dL   BUN 19 6 - 20 mg/dL   Creatinine, Ser 0.87 0.44 - 1.00 mg/dL   Calcium 8.6 (L) 8.9 - 10.3 mg/dL   GFR calc non Af Amer 57 (L) >60 mL/min   GFR calc Af Amer >60 >60 mL/min    Comment: (NOTE) The eGFR has been calculated using the CKD EPI equation. This calculation has not been validated in all clinical situations. eGFR's persistently <60 mL/min signify possible Chronic Kidney Disease.    Anion gap 7 5 - 15  CBC     Status: Abnormal   Collection Time: 11/23/15  6:11 AM  Result Value Ref Range   WBC 8.2 4.0 - 10.5 K/uL   RBC 3.81 (L) 3.87 - 5.11 MIL/uL   Hemoglobin 11.2 (L) 12.0 - 15.0 g/dL   HCT 36.0 36.0 - 46.0 %   MCV 94.5 78.0 - 100.0 fL   MCH 29.4 26.0 - 34.0 pg   MCHC 31.1 30.0 - 36.0 g/dL   RDW 13.5 11.5 - 15.5 %   Platelets 140 (L) 150 - 400 K/uL  Troponin I     Status: Abnormal   Collection Time: 11/23/15  6:11 AM  Result Value Ref Range  Troponin I 0.03 (HH) <0.03 ng/mL    Comment: CRITICAL RESULT CALLED TO, READ BACK BY AND VERIFIED WITH: B.LEAK,RN 11/23/15 0804 BY BSLADE   MRSA PCR Screening     Status: None   Collection Time: 11/23/15  1:41 PM  Result Value Ref Range   MRSA by PCR NEGATIVE NEGATIVE    Comment:        The GeneXpert MRSA Assay (FDA approved for NASAL specimens only), is one component of a comprehensive MRSA colonization surveillance program. It is not intended to diagnose MRSA infection nor to guide or monitor treatment for MRSA infections.   Troponin I     Status: Abnormal   Collection Time: 11/23/15  2:37 PM  Result Value Ref Range   Troponin I 0.18 (HH) <0.03 ng/mL    Comment: CRITICAL VALUE NOTED.  VALUE IS CONSISTENT WITH PREVIOUSLY REPORTED AND CALLED VALUE.  Troponin I     Status: Abnormal   Collection Time: 11/23/15  8:10 PM  Result Value Ref Range   Troponin I 0.10 (HH) <0.03 ng/mL    Comment: CRITICAL VALUE NOTED.  VALUE IS CONSISTENT WITH PREVIOUSLY REPORTED AND CALLED VALUE.    Dg  Wrist Complete Right  Result Date: 11/23/2015 CLINICAL DATA:  Patient had unwitnessed fall this morning. Pain and deformity to right wrist. EXAM: RIGHT WRIST - COMPLETE 3+ VIEW COMPARISON:  None. FINDINGS: Displaced fractures are noted through the distal radius and ulna. Distal fragments are displaced posteriorly. Mild overlapping fracture fragments within the distal radius. IMPRESSION: Posteriorly displaced and overlapping distal radial and ulnar fractures. Electronically Signed   By: Rolm Baptise M.D.   On: 11/23/2015 08:28  Ct Head Wo Contrast  Result Date: 11/23/2015 CLINICAL DATA:  Unwitnessed fall. Right facial swelling and bruising to right eye. EXAM: CT HEAD WITHOUT CONTRAST CT MAXILLOFACIAL WITHOUT CONTRAST CT CERVICAL SPINE WITHOUT CONTRAST TECHNIQUE: Multidetector CT imaging of the head, cervical spine, and maxillofacial structures were performed using the standard protocol without intravenous contrast. Multiplanar CT image reconstructions of the cervical spine and maxillofacial structures were also generated. COMPARISON:  None. FINDINGS: CT HEAD FINDINGS Intra cerebral hemorrhage is noted within the right frontal lobe. There likely is a small amount of subarachnoid blood overlying the right frontal lobe as well. Small subdural hemorrhages noted over the right temporal lobe measuring approximately 5 mm in thickness. No midline shift. No hydrocephalus. No acute calvarial abnormality. CT MAXILLOFACIAL FINDINGS No evidence of facial or orbital fracture. Orbital soft tissues are unremarkable. Paranasal sinuses are clear. Soft tissue swelling over the right face and orbit. CT CERVICAL SPINE FINDINGS Degenerative disc and facet disease throughout the cervical spine. Slight anterolisthesis of C4 on C5 related to facet disease. Prevertebral soft tissues are normal. No fracture. IMPRESSION: Right frontal intra cerebral hemorrhage with overlying subarachnoid and subdural hemorrhage on the right. Thickness of  subdural bleed 5 mm. No significant mass effect or midline shift. Right facial/orbital soft tissue swelling.  No underlying fracture. Cervical spondylosis.  No acute bony abnormality. Electronically Signed   By: Rolm Baptise M.D.   On: 11/23/2015 09:35  Ct Cervical Spine Wo Contrast  Result Date: 11/23/2015 CLINICAL DATA:  Unwitnessed fall. Right facial swelling and bruising to right eye. EXAM: CT HEAD WITHOUT CONTRAST CT MAXILLOFACIAL WITHOUT CONTRAST CT CERVICAL SPINE WITHOUT CONTRAST TECHNIQUE: Multidetector CT imaging of the head, cervical spine, and maxillofacial structures were performed using the standard protocol without intravenous contrast. Multiplanar CT image reconstructions of the cervical spine and maxillofacial structures were also  generated. COMPARISON:  None. FINDINGS: CT HEAD FINDINGS Intra cerebral hemorrhage is noted within the right frontal lobe. There likely is a small amount of subarachnoid blood overlying the right frontal lobe as well. Small subdural hemorrhages noted over the right temporal lobe measuring approximately 5 mm in thickness. No midline shift. No hydrocephalus. No acute calvarial abnormality. CT MAXILLOFACIAL FINDINGS No evidence of facial or orbital fracture. Orbital soft tissues are unremarkable. Paranasal sinuses are clear. Soft tissue swelling over the right face and orbit. CT CERVICAL SPINE FINDINGS Degenerative disc and facet disease throughout the cervical spine. Slight anterolisthesis of C4 on C5 related to facet disease. Prevertebral soft tissues are normal. No fracture. IMPRESSION: Right frontal intra cerebral hemorrhage with overlying subarachnoid and subdural hemorrhage on the right. Thickness of subdural bleed 5 mm. No significant mass effect or midline shift. Right facial/orbital soft tissue swelling.  No underlying fracture. Cervical spondylosis.  No acute bony abnormality. Electronically Signed   By: Rolm Baptise M.D.   On: 11/23/2015 09:35  Dg Chest Port 1  View  Result Date: 11/23/2015 CLINICAL DATA:  Unwitnessed fall at home this morning. Right facial injuries and right wrist pain. EXAM: PORTABLE CHEST 1 VIEW COMPARISON:  None. FINDINGS: Heart is mildly enlarged. No confluent airspace opacities or effusions. No acute bony abnormality. IMPRESSION: Mild cardiomegaly.  No active disease. Electronically Signed   By: Rolm Baptise M.D.   On: 11/23/2015 08:18  Ct Maxillofacial Wo Contrast  Result Date: 11/23/2015 CLINICAL DATA:  Unwitnessed fall. Right facial swelling and bruising to right eye. EXAM: CT HEAD WITHOUT CONTRAST CT MAXILLOFACIAL WITHOUT CONTRAST CT CERVICAL SPINE WITHOUT CONTRAST TECHNIQUE: Multidetector CT imaging of the head, cervical spine, and maxillofacial structures were performed using the standard protocol without intravenous contrast. Multiplanar CT image reconstructions of the cervical spine and maxillofacial structures were also generated. COMPARISON:  None. FINDINGS: CT HEAD FINDINGS Intra cerebral hemorrhage is noted within the right frontal lobe. There likely is a small amount of subarachnoid blood overlying the right frontal lobe as well. Small subdural hemorrhages noted over the right temporal lobe measuring approximately 5 mm in thickness. No midline shift. No hydrocephalus. No acute calvarial abnormality. CT MAXILLOFACIAL FINDINGS No evidence of facial or orbital fracture. Orbital soft tissues are unremarkable. Paranasal sinuses are clear. Soft tissue swelling over the right face and orbit. CT CERVICAL SPINE FINDINGS Degenerative disc and facet disease throughout the cervical spine. Slight anterolisthesis of C4 on C5 related to facet disease. Prevertebral soft tissues are normal. No fracture. IMPRESSION: Right frontal intra cerebral hemorrhage with overlying subarachnoid and subdural hemorrhage on the right. Thickness of subdural bleed 5 mm. No significant mass effect or midline shift. Right facial/orbital soft tissue swelling.  No  underlying fracture. Cervical spondylosis.  No acute bony abnormality. Electronically Signed   By: Rolm Baptise M.D.   On: 11/23/2015 09:35   ROS Blood pressure (!) 143/42, pulse 69, temperature 98.9 F (37.2 C), temperature source Oral, resp. rate 18, height 5' (1.524 m), weight 49.4 kg (109 lb), SpO2 99 %. Physical Exam  Constitutional: She is oriented to person, place, and time. She appears well-developed.  HENT:  Right eye swollen shut, black eye  Neck: Normal range of motion.  Cardiovascular: Normal rate.   Respiratory: Effort normal.  Musculoskeletal:  Mild right finger swelling. Sensation intact median and ulnar nerve.    Neurological: She is alert and oriented to person, place, and time.  Skin: Skin is warm and dry.  Psychiatric: She has a  normal mood and affect. Her behavior is normal. Thought content normal.    Assessment/Plan: Distal radius fracture. Will check portable xrays to determine present position in splint . If she is still significantly displaced then will need reduction . My cell [phone]  Marybelle Killings 11/23/2015, 8:56 PM

## 2015-11-23 NOTE — ED Notes (Signed)
Re-dressed right eye. Gauze was saturated. Now clean/dry

## 2015-11-23 NOTE — ED Notes (Signed)
MD at bedside. 

## 2015-11-23 NOTE — ED Triage Notes (Signed)
Pt in EMS from home, had a unwitnessed fall, not sure how long pt was down. Pt has facial injury and R FA injury. Pt does take plavix. Pt A/OX4

## 2015-11-23 NOTE — ED Provider Notes (Signed)
[facility]-EMERGENCY DEPT Provider Note   CSN: [id number] Arrival date & time: 11/23/15  8295  First Provider Contact:  None       History   Chief Complaint Chief Complaint  Patient presents with  . Fall  . Facial Injury  . Arm Injury    HPI Elizabeth Barry is a 82 y.o. female.  The history is provided by the patient and a relative.  Fall   Facial Injury  Arm Injury    She fell at home while going to turn off an air conditioner. This is a normal activity for her. Family members found her on the floor. She suffered facial injury and injury to her right wrist. She denies neck, back, chest, hip injury. There was no loss of consciousness. She is unsure when her last tetanus immunization was. Of note, she takes Plavix because of presence of coronary stents.  Past Medical History:  Diagnosis Date  . Hypertension     There are no active problems to display for this patient.   History reviewed. No pertinent surgical history.  OB History    No data available       Home Medications    Prior to Admission medications   Not on File    Family History No family history on file.  Social History Social History  Substance Use Topics  . Smoking status: Not on file  . Smokeless tobacco: Not on file  . Alcohol use Not on file     Allergies   Contrast media [iodinated diagnostic agents]   Review of Systems Review of Systems  All other systems reviewed and are negative.    Physical Exam Updated Vital Signs BP 140/68 (BP Location: Left Arm)   Pulse 70   Temp 97 F (36.1 C) (Axillary)   Resp 20   Ht 5' (1.524 m)   Wt 108 lb (49 kg)   SpO2 97%   BMI 21.09 kg/m   Physical Exam  Nursing note and vitals reviewed.  82 year old female, resting comfortably and in no acute distress. Vital signs are normal. Oxygen saturation is 97%, which is normal. Head is normocephalic. Extensive facial ecchymosis is present. There is a superficial laceration above the right eye.  Several small intraoral lacerations are present as well-none requiring suturing. No dental trauma is noted. Mandible is nontender and without any obvious step off. Right eye is swollen almost completely shut. When lids are separated, she has severe subconjunctival hemorrhage but no obvious anterior chamber injury. She is not able to cooperate for assessment of vision. Oropharynx is clear. Neck is nontender without adenopathy or JVD. Back is nontender and there is no CVA tenderness. Lungs are clear without rales, wheezes, or rhonchi. Chest is tender in the right side anteriorly without crepitus. Heart has regular rate and rhythm without murmur. Abdomen is soft, flat, nontender without masses or hepatosplenomegaly and peristalsis is normoactive. Extremities: Deformity is present of the right wrist consistent with Colles' fracture. Distal neurovascular exam is intact. No other extremity injury is seen. Skin is warm and dry without rash. Neurologic: Mental status is normal, cranial nerves are intact, there are no motor or sensory deficits.  ED Treatments / Results  Labs (all labs ordered are listed, but only abnormal results are displayed) Results for orders placed or performed during the hospital encounter of 11/23/15  Basic metabolic panel  Result Value Ref Range   Sodium 137 135 - 145 mmol/L   Potassium 3.9 3.5 - 5.1 mmol/L  Chloride 107 101 - 111 mmol/L   CO2 23 22 - 32 mmol/L   Glucose, Bld 108 (H) 65 - 99 mg/dL   BUN 19 6 - 20 mg/dL   Creatinine, Ser 1.61 0.44 - 1.00 mg/dL   Calcium 8.6 (L) 8.9 - 10.3 mg/dL   GFR calc non Af Amer 57 (L) >60 mL/min   GFR calc Af Amer >60 >60 mL/min   Anion gap 7 5 - 15  CBC  Result Value Ref Range   WBC 8.2 4.0 - 10.5 K/uL   RBC 3.81 (L) 3.87 - 5.11 MIL/uL   Hemoglobin 11.2 (L) 12.0 - 15.0 g/dL   HCT 09.6 04.5 - 40.9 %   MCV 94.5 78.0 - 100.0 fL   MCH 29.4 26.0 - 34.0 pg   MCHC 31.1 30.0 - 36.0 g/dL   RDW 81.1 91.4 - 78.2 %   Platelets 140 (L)  150 - 400 K/uL  Urinalysis, Routine w reflex microscopic  Result Value Ref Range   Color, Urine YELLOW YELLOW   APPearance CLOUDY (A) CLEAR   Specific Gravity, Urine 1.012 1.005 - 1.030   pH 6.0 5.0 - 8.0   Glucose, UA NEGATIVE NEGATIVE mg/dL   Hgb urine dipstick NEGATIVE NEGATIVE   Bilirubin Urine NEGATIVE NEGATIVE   Ketones, ur NEGATIVE NEGATIVE mg/dL   Protein, ur NEGATIVE NEGATIVE mg/dL   Nitrite NEGATIVE NEGATIVE   Leukocytes, UA TRACE (A) NEGATIVE  Urine microscopic-add on  Result Value Ref Range   Squamous Epithelial / LPF 0-5 (A) NONE SEEN   WBC, UA 0-5 0 - 5 WBC/hpf   RBC / HPF 0-5 0 - 5 RBC/hpf   Bacteria, UA FEW (A) NONE SEEN  CBG monitoring, ED  Result Value Ref Range   Glucose-Capillary 116 (H) 65 - 99 mg/dL   EKG  EKG Interpretation  Date/Time:  Tuesday November 23 2015 05:43:50 EDT Ventricular Rate:  65 PR Interval:    QRS Duration: 146 QT Interval:  446 QTC Calculation: 464 R Axis:   80 Text Interpretation:  Sinus rhythm IVCD, consider atypical RBBB No old tracing to compare Confirmed by [facility]  MD, Alisabeth Selkirk ([id number]) on 11/23/2015 5:52:48 AM       Radiology Dg Wrist Complete Right  Result Date: 11/23/2015 CLINICAL DATA:  Patient had unwitnessed fall this morning. Pain and deformity to right wrist. EXAM: RIGHT WRIST - COMPLETE 3+ VIEW COMPARISON:  None. FINDINGS: Displaced fractures are noted through the distal radius and ulna. Distal fragments are displaced posteriorly. Mild overlapping fracture fragments within the distal radius. IMPRESSION: Posteriorly displaced and overlapping distal radial and ulnar fractures. Electronically Signed   By: Charlett Nose M.D.   On: 11/23/2015 08:28  Ct Head Wo Contrast  Result Date: 11/23/2015 CLINICAL DATA:  Unwitnessed fall. Right facial swelling and bruising to right eye. EXAM: CT HEAD WITHOUT CONTRAST CT MAXILLOFACIAL WITHOUT CONTRAST CT CERVICAL SPINE WITHOUT CONTRAST TECHNIQUE: Multidetector CT imaging of the head, cervical  spine, and maxillofacial structures were performed using the standard protocol without intravenous contrast. Multiplanar CT image reconstructions of the cervical spine and maxillofacial structures were also generated. COMPARISON:  None. FINDINGS: CT HEAD FINDINGS Intra cerebral hemorrhage is noted within the right frontal lobe. There likely is a small amount of subarachnoid blood overlying the right frontal lobe as well. Small subdural hemorrhages noted over the right temporal lobe measuring approximately 5 mm in thickness. No midline shift. No hydrocephalus. No acute calvarial abnormality. CT MAXILLOFACIAL FINDINGS No evidence of facial or orbital  fracture. Orbital soft tissues are unremarkable. Paranasal sinuses are clear. Soft tissue swelling over the right face and orbit. CT CERVICAL SPINE FINDINGS Degenerative disc and facet disease throughout the cervical spine. Slight anterolisthesis of C4 on C5 related to facet disease. Prevertebral soft tissues are normal. No fracture. IMPRESSION: Right frontal intra cerebral hemorrhage with overlying subarachnoid and subdural hemorrhage on the right. Thickness of subdural bleed 5 mm. No significant mass effect or midline shift. Right facial/orbital soft tissue swelling.  No underlying fracture. Cervical spondylosis.  No acute bony abnormality. Electronically Signed   By: Charlett Nose M.D.   On: 11/23/2015 09:35  Ct Cervical Spine Wo Contrast  Result Date: 11/23/2015 CLINICAL DATA:  Unwitnessed fall. Right facial swelling and bruising to right eye. EXAM: CT HEAD WITHOUT CONTRAST CT MAXILLOFACIAL WITHOUT CONTRAST CT CERVICAL SPINE WITHOUT CONTRAST TECHNIQUE: Multidetector CT imaging of the head, cervical spine, and maxillofacial structures were performed using the standard protocol without intravenous contrast. Multiplanar CT image reconstructions of the cervical spine and maxillofacial structures were also generated. COMPARISON:  None. FINDINGS: CT HEAD FINDINGS Intra  cerebral hemorrhage is noted within the right frontal lobe. There likely is a small amount of subarachnoid blood overlying the right frontal lobe as well. Small subdural hemorrhages noted over the right temporal lobe measuring approximately 5 mm in thickness. No midline shift. No hydrocephalus. No acute calvarial abnormality. CT MAXILLOFACIAL FINDINGS No evidence of facial or orbital fracture. Orbital soft tissues are unremarkable. Paranasal sinuses are clear. Soft tissue swelling over the right face and orbit. CT CERVICAL SPINE FINDINGS Degenerative disc and facet disease throughout the cervical spine. Slight anterolisthesis of C4 on C5 related to facet disease. Prevertebral soft tissues are normal. No fracture. IMPRESSION: Right frontal intra cerebral hemorrhage with overlying subarachnoid and subdural hemorrhage on the right. Thickness of subdural bleed 5 mm. No significant mass effect or midline shift. Right facial/orbital soft tissue swelling.  No underlying fracture. Cervical spondylosis.  No acute bony abnormality. Electronically Signed   By: Charlett Nose M.D.   On: 11/23/2015 09:35  Dg Chest Port 1 View  Result Date: 11/23/2015 CLINICAL DATA:  Unwitnessed fall at home this morning. Right facial injuries and right wrist pain. EXAM: PORTABLE CHEST 1 VIEW COMPARISON:  None. FINDINGS: Heart is mildly enlarged. No confluent airspace opacities or effusions. No acute bony abnormality. IMPRESSION: Mild cardiomegaly.  No active disease. Electronically Signed   By: Charlett Nose M.D.   On: 11/23/2015 08:18  Ct Maxillofacial Wo Contrast  Result Date: 11/23/2015 CLINICAL DATA:  Unwitnessed fall. Right facial swelling and bruising to right eye. EXAM: CT HEAD WITHOUT CONTRAST CT MAXILLOFACIAL WITHOUT CONTRAST CT CERVICAL SPINE WITHOUT CONTRAST TECHNIQUE: Multidetector CT imaging of the head, cervical spine, and maxillofacial structures were performed using the standard protocol without intravenous contrast.  Multiplanar CT image reconstructions of the cervical spine and maxillofacial structures were also generated. COMPARISON:  None. FINDINGS: CT HEAD FINDINGS Intra cerebral hemorrhage is noted within the right frontal lobe. There likely is a small amount of subarachnoid blood overlying the right frontal lobe as well. Small subdural hemorrhages noted over the right temporal lobe measuring approximately 5 mm in thickness. No midline shift. No hydrocephalus. No acute calvarial abnormality. CT MAXILLOFACIAL FINDINGS No evidence of facial or orbital fracture. Orbital soft tissues are unremarkable. Paranasal sinuses are clear. Soft tissue swelling over the right face and orbit. CT CERVICAL SPINE FINDINGS Degenerative disc and facet disease throughout the cervical spine. Slight anterolisthesis of C4 on C5 related  to facet disease. Prevertebral soft tissues are normal. No fracture. IMPRESSION: Right frontal intra cerebral hemorrhage with overlying subarachnoid and subdural hemorrhage on the right. Thickness of subdural bleed 5 mm. No significant mass effect or midline shift. Right facial/orbital soft tissue swelling.  No underlying fracture. Cervical spondylosis.  No acute bony abnormality. Electronically Signed   By: Charlett Nose M.D.   On: 11/23/2015 09:35   Procedures Procedures (including critical care time) CRITICAL CARE Performed by: ZOXWR,UEAVW Total critical care time: 35 minutes Critical care time was exclusive of separately billable procedures and treating other patients. Critical care was necessary to treat or prevent imminent or life-threatening deterioration. Critical care was time spent personally by me on the following activities: development of treatment plan with patient and/or surrogate as well as nursing, discussions with consultants, evaluation of patient's response to treatment, examination of patient, obtaining history from patient or surrogate, ordering and performing treatments and  interventions, ordering and review of laboratory studies, ordering and review of radiographic studies, pulse oximetry and re-evaluation of patient's condition.   Medications Ordered in ED Medications  nitroGLYCERIN (NITROSTAT) SL tablet 0.4 mg (not administered)  morphine 4 MG/ML injection 4 mg (not administered)  ondansetron (ZOFRAN) injection 4 mg (not administered)  ondansetron (ZOFRAN) injection 4 mg (4 mg Intravenous Given 11/23/15 0625)  morphine 4 MG/ML injection 4 mg (4 mg Intravenous Given 11/23/15 0625)  Tdap (BOOSTRIX) injection 0.5 mL (0.5 mLs Intramuscular Given 11/23/15 0981)     Initial Impression / Assessment and Plan / ED Course  I have reviewed the triage vital signs and the nursing notes.  Pertinent labs & imaging results that were available during my care of the patient were reviewed by me and considered in my medical decision making (see chart for details).  Clinical Course    Fall with facial injury and palpable right distal forearm fracture. She is sent for CT of head, maxillofacial bones, cervical spine, and plain films of her right wrist. She has no old records and the [facility] system.  Patient was at CT scan and started complaining of some chest pain. This seems to be separate from where her chest wall is tender. She will be given nitroglycerin. Head CT does show evidence of orbital fracture and some bleeding into the frontal lobe. Neurosurgery is being consulted.  Case is discussed with Dr. Jordan Likes who agrees to see the patient and consultation. Because of chest pain, she will be admitted to the internal medicine service. At this point, wrist x-ray has not been obtained, but she will need hand surgery consultation once this has been done.  Final Clinical Impressions(s) / ED Diagnoses   Final diagnoses:  Fall at home, initial encounter  Cerebral contusion, without loss of consciousness, initial encounter [facility])    Closed fracture of distal end of right radius  with ulna, initial encounter  Chest pain, unspecified    New Prescriptions New Prescriptions   No medications on file     Dione Booze, MD 11/23/15 2121

## 2015-11-23 NOTE — ED Notes (Signed)
Patient was given a emesis bag, because she was feeling sick.

## 2015-11-24 ENCOUNTER — Inpatient Hospital Stay (HOSPITAL_COMMUNITY): Payer: Medicare Other

## 2015-11-24 DIAGNOSIS — S065X0A Traumatic subdural hemorrhage without loss of consciousness, initial encounter: Secondary | ICD-10-CM

## 2015-11-24 DIAGNOSIS — Z7982 Long term (current) use of aspirin: Secondary | ICD-10-CM

## 2015-11-24 DIAGNOSIS — Z7902 Long term (current) use of antithrombotics/antiplatelets: Secondary | ICD-10-CM

## 2015-11-24 DIAGNOSIS — Y92009 Unspecified place in unspecified non-institutional (private) residence as the place of occurrence of the external cause: Secondary | ICD-10-CM

## 2015-11-24 DIAGNOSIS — Y92013 Bedroom of single-family (private) house as the place of occurrence of the external cause: Secondary | ICD-10-CM

## 2015-11-24 DIAGNOSIS — S52531A Colles' fracture of right radius, initial encounter for closed fracture: Secondary | ICD-10-CM

## 2015-11-24 DIAGNOSIS — I2510 Atherosclerotic heart disease of native coronary artery without angina pectoris: Secondary | ICD-10-CM

## 2015-11-24 DIAGNOSIS — M80831A Other osteoporosis with current pathological fracture, right forearm, initial encounter for fracture: Secondary | ICD-10-CM

## 2015-11-24 DIAGNOSIS — S52601A Unspecified fracture of lower end of right ulna, initial encounter for closed fracture: Secondary | ICD-10-CM

## 2015-11-24 DIAGNOSIS — S066X0A Traumatic subarachnoid hemorrhage without loss of consciousness, initial encounter: Principal | ICD-10-CM

## 2015-11-24 DIAGNOSIS — Z955 Presence of coronary angioplasty implant and graft: Secondary | ICD-10-CM

## 2015-11-24 DIAGNOSIS — Y9301 Activity, walking, marching and hiking: Secondary | ICD-10-CM

## 2015-11-24 DIAGNOSIS — S06339A Contusion and laceration of cerebrum, unspecified, with loss of consciousness of unspecified duration, initial encounter: Secondary | ICD-10-CM

## 2015-11-24 DIAGNOSIS — Z9181 History of falling: Secondary | ICD-10-CM

## 2015-11-24 DIAGNOSIS — R778 Other specified abnormalities of plasma proteins: Secondary | ICD-10-CM

## 2015-11-24 DIAGNOSIS — S0633AA Contusion and laceration of cerebrum, unspecified, with loss of consciousness status unknown, initial encounter: Secondary | ICD-10-CM

## 2015-11-24 DIAGNOSIS — W19XXXA Unspecified fall, initial encounter: Secondary | ICD-10-CM

## 2015-11-24 DIAGNOSIS — S52501A Unspecified fracture of the lower end of right radius, initial encounter for closed fracture: Secondary | ICD-10-CM

## 2015-11-24 DIAGNOSIS — I10 Essential (primary) hypertension: Secondary | ICD-10-CM

## 2015-11-24 LAB — BASIC METABOLIC PANEL
ANION GAP: 5 (ref 5–15)
BUN: 17 mg/dL (ref 6–20)
CHLORIDE: 112 mmol/L — AB (ref 101–111)
CO2: 22 mmol/L (ref 22–32)
Calcium: 8.5 mg/dL — ABNORMAL LOW (ref 8.9–10.3)
Creatinine, Ser: 0.83 mg/dL (ref 0.44–1.00)
GFR calc Af Amer: >60 mL/min (ref >60)
GFR, EST NON AFRICAN AMERICAN: 60 mL/min — AB (ref >60)
GLUCOSE: 83 mg/dL (ref 65–99)
POTASSIUM: 3.7 mmol/L (ref 3.5–5.1)
Sodium: 139 mmol/L (ref 135–145)

## 2015-11-24 LAB — CBC
HEMATOCRIT: 34.3 % — AB (ref 36.0–46.0)
HEMOGLOBIN: 10.4 g/dL — AB (ref 12.0–15.0)
MCH: 29.1 pg (ref 26.0–34.0)
MCHC: 30.3 g/dL (ref 30.0–36.0)
MCV: 95.8 fL (ref 78.0–100.0)
Platelets: 134 10*3/uL — ABNORMAL LOW (ref 150–400)
RBC: 3.58 MIL/uL — ABNORMAL LOW (ref 3.87–5.11)
RDW: 13.7 % (ref 11.5–15.5)
WBC: 8.4 10*3/uL (ref 4.0–10.5)

## 2015-11-24 MED ORDER — CYCLOBENZAPRINE HCL 10 MG PO TABS
5.0000 mg | ORAL_TABLET | Freq: Three times a day (TID) | ORAL | Status: DC | PRN
Start: 2015-11-24 — End: 2015-11-29
  Administered 2015-11-26 – 2015-11-28 (×3): 5 mg via ORAL
  Filled 2015-11-24 (×3): qty 1

## 2015-11-24 MED ORDER — OXYCODONE-ACETAMINOPHEN 5-325 MG PO TABS
1.0000 | ORAL_TABLET | ORAL | Status: DC | PRN
  Administered 2015-11-24: 1 via ORAL
  Administered 2015-11-24 (×2): 2 via ORAL
  Administered 2015-11-25 – 2015-11-26 (×2): 1 via ORAL
  Filled 2015-11-24 (×3): qty 1
  Filled 2015-11-24 (×2): qty 2
  Filled 2015-11-24: qty 1
  Filled 2015-11-24: qty 2

## 2015-11-24 MED ORDER — LIDOCAINE HCL (PF) 1 % IJ SOLN
INTRAMUSCULAR | Status: AC
  Administered 2015-11-24: 30 mL
  Filled 2015-11-24: qty 30

## 2015-11-24 NOTE — Progress Notes (Signed)
PT Cancellation Note  Patient Details Name: Elizabeth Barry MRN: [id number] DOB: 1922/06/07   Cancelled Treatment:    Reason Eval/Treat Not Completed: Medical issues which prohibited therapy.  Pt on bedrest, due to have R arm set this PM.  PT to check back tomorrow.   Thanks,    Rollene Rotunda. Glynis Hunsucker, PT, DPT [id number]   11/24/2015, 11:52 AM

## 2015-11-24 NOTE — Progress Notes (Signed)
Orthopedic Tech Progress Note Patient Details:  Elizabeth Barry 31-Jan-1923 [id number]  Ortho Devices Type of Ortho Device: Ace wrap, Finger trap Finger Trap Weight: 10lb Ortho Device/Splint Location: sugar tong splint Ortho Device/Splint Interventions: Application   Saul Fordyce 11/24/2015, 6:38 PM

## 2015-11-24 NOTE — Care Management Important Message (Signed)
Important Message  Patient Details  Name: Elizabeth Barry MRN: [id number] Date of Birth: August 15, 1922   Medicare Important Message Given:  Yes    [name] 11/24/2015, 1:13 PM

## 2015-11-24 NOTE — Progress Notes (Signed)
Patient ID: Elizabeth Barry, female   DOB: [birth year], 82 y.o.   MRN: [id number] Right distal radius and ulna reduced with hematoma fracture block, fingertraps, 10 lbs traction , reduction and sugartong splint. She tolerated it well. Finger dressing applied.  Post reduction xrays pending. If acceptable will re-xray in one week and if still good she will get a short arm cast in 2 wks from today.   My phone [phone].

## 2015-11-24 NOTE — Progress Notes (Signed)
Patient ID: Elizabeth Barry, female   DOB: [birth year], 82 y.o.   MRN: [id number] Jillyn Hidden shows that after spint applied by Ortho Tech she has shortening and angulation. With her age and other problems will reduce with hematoma fracture block this evening after my office hours and try to get her in satisfactory position. If her fracture redisplaces then surgery will be needed.   [phone]

## 2015-11-24 NOTE — Progress Notes (Signed)
Patient ID: Elizabeth Barry, female   DOB: 04-26-1923, 82 y.o.   MRN: [id number] Post reduction xrays look good with acceptable alignment and minimal angulation.  No surgery planned unless she displaces. Repeat xrays in one week in the splint and if everything holds, will cast the following week.   Ophelia Charter   [phone]

## 2015-11-24 NOTE — Progress Notes (Signed)
Orthopedic Tech Progress Note Patient Details:  Elizabeth Barry Sep 03, 1922 [id number]  Ortho Devices Type of Ortho Device: Other (comment) Ortho Device/Splint Location: rue sling arm elevator Ortho Device/Splint Interventions: Ordered, Application   Trinna Post 11/24/2015, 4:29 AM

## 2015-11-24 NOTE — Progress Notes (Signed)
Subjective: C/O pain at her right arm and at her face. Was able to open her right eye, little improved from yesterday. C/O some painful muscle spasms in her right hand.Eating her breakfast.Daughter was at bed side.  Objective:  Vital signs in last 24 hours: Vitals:   11/24/15 0000 11/24/15 0400 11/24/15 0805 11/24/15 1146  BP: (!) 132/51 (!) 127/50 (!) 145/45 (!) 135/54  Pulse: 62 61 64 73  Resp: 20 (!) 21 20 18   Temp:  98.4 F (36.9 C) 98.5 F (36.9 C) 98.5 F (36.9 C)  TempSrc:  Oral Oral Oral  SpO2: 98% 99% 100% 96%  Weight:      Height:       Physical Exam: General: Vital signs reviewed.  Patient is elderly, in no acute distress and cooperative with exam.  Head: Large right sided ecchymosis with hematoma over right forehead extending over right orbit to right side of mouth. Edema has improved as compare to yesterday. Eyes: Right orbit edematous with large ecchymosis, patient is able to open little bit. Laceration below right orbit. Left PERRLA with EOMI intact. Mouth: Teeth are intact. . Normal tongue. Edema and ecchymosis over right side of lip. Neck: Supple, trachea midline.  Cardiovascular: RRR, 3/6 blowing systolic murmur. Chest: Clear to auscultation bilaterally, no wheezes, rales, or rhonchi. Abdominal: Soft, non-tender, non-distended, BS +, no guarding present.  Extremities: No lower extremity edema bilaterally, pulses symmetric and intact bilaterally. Right wrist is in splint, and elevated.  Neurological: A&O x3,   Labs: BMP Latest Ref Rng & Units 11/24/2015 11/23/2015  Glucose 65 - 99 mg/dL 83 583(E)  BUN 6 - 20 mg/dL 17 19  Creatinine 9.40 - 1.00 mg/dL 7.68 0.88  Sodium 110 - 145 mmol/L 139 137  Potassium 3.5 - 5.1 mmol/L 3.7 3.9  Chloride 101 - 111 mmol/L 112(H) 107  CO2 22 - 32 mmol/L 22 23  Calcium 8.9 - 10.3 mg/dL 3.1(R) 9.4(V)   CBC Latest Ref Rng & Units 11/24/2015 11/23/2015  WBC 4.0 - 10.5 K/uL 8.4 8.2  Hemoglobin 12.0 - 15.0 g/dL 10.4(L) 11.2(L)    Hematocrit 36.0 - 46.0 % 34.3(L) 36.0  Platelets 150 - 400 K/uL 134(L) 140(L)     Assessment/Plan: Ms. Linge is a 82 yo female with PMHx of HTN, CAD s/p stent placement x 2 3 years ago, and history of 2 falls over last 5 years, who presents after a fall.She was on Aspirin and Plavix.  Cerebral Hemorrhage with Subdural Hematoma and Subarachnoid Hemorrhage: Secondary to fall at home. CT head/maxillofacial/cervical spine showed a right frontal intra-cerebral hemorrhage with overlying subarachnoid and subdural hemorrhage on the right. Thickness of subdural bleed 5 mm. No significant mass effect or midline shift. Right facial/orbital soft tissue swelling, no orbital fracture. Dr. Jordan Likes with Neurosurgery recommend conservative management due to her age and other comorbidies. Patient is awake, alert and oriented x 3.No evidence of neurological deficits on exam -Hold Plavix and Aspirin, She might not need plavix any more as her stent placement was done 3 years ago. -Morphine 2 mg IV Q3H prn pain -Zofran 4 mg Q6H prn nausea  Displaced Right Radial and Ulnar Wrist Fracture: Xray of right wrist revealed posteriorly displaced and overlapping distal radial and ulnar fractures. Neurovascularly intact. Orthopedic surgery will try for closed reduction this afternoon, might consider surgery if unsuccessful. -Morphine 2 mg IV Q3H prn pain -flexeril for some muscle spasms in that hand.  Chest Pain: Transient mid-sternal dull achey chest pain after arrival to  ED which resolved. Patient was given nitroglycerin. Patient does have a history of CAD, . Incresed in trendingTroponin 0.03,0.18  0.10 w no evidence of ischemic changes on EKG. No evidence of rib fracture on CXR. Might be due to transient strain on her heart with fall. -Telemetry for any possible arrhythmias. -Repeat EKG tomorrow am -Nitro prn -No ASA/Plavix due to cerebral hemorrhage  CAD s/p Stent Placement: Patient follows with Dr. Kirtland Bouchard" in [place]. She  is on plavix 75 mg daily, metoprolol 25 mg BID, atorvastatin 80 mg daily, lisinopril 10 mg daily at home. Stent placement was greater than 3 years ago per patient. -Holding plavix/ASA due to bleed -Continue atorvastatin 80 mg daily -Holding anti-hypertensive for now  History of Falls: This patient's 3rd fall in the last 3 years with prior hip fracture and shoulder injury. Patient lives by herself at home and uses [employer]. She ambulates with a walker and can complete all of her ADLs independently. Son lives 0.5 miles away. Family is concerned about her safety and they are planning if she can move with any of her 3 kids. In the past patient was hesitant and wants to stay independent. -PT/OT  HTN: Normotensive. Target BP 160/90.Her BP stay b/w 145/45- 127/47. Patient is on lisinopril 10 mg daily, Lasix 20 mg daily, Metoprolol 25 mg BID and Hydralazine 25 mg TID at home.  -Holding for now   Depression: Patient is on citalopram 10 mg daily at home.  -Continue Citalopram  Dispo: Anticipated discharge in approximately 2-3 day(s).   LOS: 1 day   Arnetha Courser, MD 11/24/2015, 4:44 PM Pager: [phone]

## 2015-11-24 NOTE — Progress Notes (Signed)
Internal Medicine Attending  Date: 11/24/2015  Patient name: Elizabeth Barry Medical record number: [id number] Date of birth: [birth year] Age: 82 y.o. Gender: female  I saw and evaluated the patient. I reviewed the resident's note by Dr. Nelson Chimes and I agree with the resident's findings and plans as documented in her progress note.  Please see my H&P dated 11/24/2015 the specifics of my evaluation, assessment, plan from earlier today.

## 2015-11-24 NOTE — Progress Notes (Signed)
OT Cancellation Note  Patient Details Name: Destane Kahn MRN: [id number] DOB: [birth year]   Cancelled Treatment:    Reason Eval/Treat Not Completed: Patient not medically ready (active bedrest orders). Will follow up for OT eval with updated activity orders.  Gaye Alken M.S., OTR/L Pager: [phone]  11/24/2015, 11:51 AM

## 2015-11-24 NOTE — H&P (Signed)
Internal Medicine Attending Admission Note Date: 11/24/2015  Patient name: Elizabeth Barry Medical record number: [id number] Date of birth: 1922/06/30 Age: 82 y.o. Gender: female  I saw and evaluated the patient. I reviewed the resident's note and I agree with the resident's findings and plan as documented in the resident's note.  Chief Complaint(s): Fall  History - key components related to admission:  Elizabeth Barry is a 82 year old woman with a history of coronary artery disease status post stent placement 2 three years ago currently on aspirin and Plavix, hypertension, and a history of significant falls 2 in the last 5 years resulting in a shoulder injury and a hip fracture who presents after a fall today. She lives alone and has 2 bedrooms in her house. These bedrooms are connected by hallway. One bedroom has a window unit air conditioner and the other room is her bedroom. She noted that her bedroom was getting too cold so she got up to walk to the bedroom with the air conditioner in it to shut it off. The next thing she knows she is on the floor and she pushed her medic alert button. She is not sure how she ended up on the ground but denies any acute chest pain, palpitations, shortness of breath, dizziness, vertigo, visual changes, or weakness prior to this event. In addition to an injury to her right wrist she had significant ecchymoses and hematoma over the right side of the face. She was therefore transported to the emergency department and admitted to the internal medicine teaching service for further evaluation and care.  Workup in the emergency department included films of her right wrist which showed a Colles' fracture as well as a head CT which showed a right frontal intracerebral hemorrhage with overlying subarachnoid and subdural hemorrhage. The thickness of the subdural bleed was 5 mm. There was no significant mass effect or midline shift and no facial or skull fractures. She was seen by  orthopedic surgery and attempt was made to reduce the fracture but this was unsuccessful. She was also seen by neurosurgery who, given her multiple core morbidities and the fact that she was on Plavix and aspirin, was not interested in doing any surgical intervention and felt it would be best to monitor her closely given the risks of neurosurgery.  When seen on rounds the morning after admission she noted pain in the right wrist and right face but was without other complaints.  Physical Exam - key components related to admission:  Vitals:   11/24/15 0000 11/24/15 0400 11/24/15 0805 11/24/15 1146  BP: (!) 132/51 (!) 127/50 (!) 145/45 (!) 135/54  Pulse: 62 61 64 73  Resp: 20 (!) 21 20 18   Temp:  98.4 F (36.9 C) 98.5 F (36.9 C) 98.5 F (36.9 C)  TempSrc:  Oral Oral Oral  SpO2: 98% 99% 100% 96%  Weight:      Height:       Gen.: Well-developed, well nourished, elderly woman lying comfortably in bed in no acute distress.  HEENT, significant ecchymoses over the right side of the face with swelling of the right eye.  Extremities: Right upper extremity in a splint and hanging to gravity. Neuro: Distal strength and sensation grossly intact.  Lab results:  Basic Metabolic Panel:  Recent Labs  47/82/95 0611 11/24/15 0331  NA 137 139  K 3.9 3.7  CL 107 112*  CO2 23 22  GLUCOSE 108* 83  BUN 19 17  CREATININE 0.87 0.83  CALCIUM 8.6* 8.5*  CBC:  Recent Labs  11/23/15 0611 11/24/15 0331  WBC 8.2 8.4  HGB 11.2* 10.4*  HCT 36.0 34.3*  MCV 94.5 95.8  PLT 140* 134*   Cardiac Enzymes:  Recent Labs  11/23/15 0611 11/23/15 1437 11/23/15 2010  TROPONINI 0.03* 0.18* 0.10*   CBG:  Recent Labs  11/23/15 0559 11/23/15 2121  GLUCAP 116* 82   Urinalysis:  Cloudy, yellow, specific gravity 1.012, pH 6.0, negative nitrite, trace leukocytes, 0-5 white blood cells per high-power field, 0-5 red blood cells per high-power field.  Imaging results:  Dg Wrist 2 Views  Right  Result Date: 11/23/2015 CLINICAL DATA:  82 year old female with wrist fracture post splinting. Subsequent encounter. EXAM: RIGHT WRIST - 2 VIEW COMPARISON:  11/23/2015. FINDINGS: Attempted reduction of right wrist fracture.  Splint in place. There remains significant displacement of the distal radial fracture and carpal bones with respect to the radial metaphysis (dorsal and radial displacement) with overlapping of fracture fragments. Angled distal ulna fracture. IMPRESSION: Attempted reduction of right wrist fracture. Splint in place. There remains significant displacement of the distal radial fracture and carpal bones with respect to the radial metaphysis (dorsal and radial displacement) with overlapping of fracture fragments. Angled distal ulna fracture. Electronically Signed   By: Lacy Duverney M.D.   On: 11/23/2015 21:36  Dg Wrist Complete Right  Result Date: 11/23/2015 CLINICAL DATA:  Patient had unwitnessed fall this morning. Pain and deformity to right wrist. EXAM: RIGHT WRIST - COMPLETE 3+ VIEW COMPARISON:  None. FINDINGS: Displaced fractures are noted through the distal radius and ulna. Distal fragments are displaced posteriorly. Mild overlapping fracture fragments within the distal radius. IMPRESSION: Posteriorly displaced and overlapping distal radial and ulnar fractures. Electronically Signed   By: Charlett Nose M.D.   On: 11/23/2015 08:28  Ct Head Wo Contrast  Result Date: 11/23/2015 CLINICAL DATA:  Unwitnessed fall. Right facial swelling and bruising to right eye. EXAM: CT HEAD WITHOUT CONTRAST CT MAXILLOFACIAL WITHOUT CONTRAST CT CERVICAL SPINE WITHOUT CONTRAST TECHNIQUE: Multidetector CT imaging of the head, cervical spine, and maxillofacial structures were performed using the standard protocol without intravenous contrast. Multiplanar CT image reconstructions of the cervical spine and maxillofacial structures were also generated. COMPARISON:  None. FINDINGS: CT HEAD FINDINGS Intra  cerebral hemorrhage is noted within the right frontal lobe. There likely is a small amount of subarachnoid blood overlying the right frontal lobe as well. Small subdural hemorrhages noted over the right temporal lobe measuring approximately 5 mm in thickness. No midline shift. No hydrocephalus. No acute calvarial abnormality. CT MAXILLOFACIAL FINDINGS No evidence of facial or orbital fracture. Orbital soft tissues are unremarkable. Paranasal sinuses are clear. Soft tissue swelling over the right face and orbit. CT CERVICAL SPINE FINDINGS Degenerative disc and facet disease throughout the cervical spine. Slight anterolisthesis of C4 on C5 related to facet disease. Prevertebral soft tissues are normal. No fracture. IMPRESSION: Right frontal intra cerebral hemorrhage with overlying subarachnoid and subdural hemorrhage on the right. Thickness of subdural bleed 5 mm. No significant mass effect or midline shift. Right facial/orbital soft tissue swelling.  No underlying fracture. Cervical spondylosis.  No acute bony abnormality. Electronically Signed   By: Charlett Nose M.D.   On: 11/23/2015 09:35  Ct Cervical Spine Wo Contrast  Result Date: 11/23/2015 CLINICAL DATA:  Unwitnessed fall. Right facial swelling and bruising to right eye. EXAM: CT HEAD WITHOUT CONTRAST CT MAXILLOFACIAL WITHOUT CONTRAST CT CERVICAL SPINE WITHOUT CONTRAST TECHNIQUE: Multidetector CT imaging of the head, cervical spine, and maxillofacial structures  were performed using the standard protocol without intravenous contrast. Multiplanar CT image reconstructions of the cervical spine and maxillofacial structures were also generated. COMPARISON:  None. FINDINGS: CT HEAD FINDINGS Intra cerebral hemorrhage is noted within the right frontal lobe. There likely is a small amount of subarachnoid blood overlying the right frontal lobe as well. Small subdural hemorrhages noted over the right temporal lobe measuring approximately 5 mm in thickness. No midline  shift. No hydrocephalus. No acute calvarial abnormality. CT MAXILLOFACIAL FINDINGS No evidence of facial or orbital fracture. Orbital soft tissues are unremarkable. Paranasal sinuses are clear. Soft tissue swelling over the right face and orbit. CT CERVICAL SPINE FINDINGS Degenerative disc and facet disease throughout the cervical spine. Slight anterolisthesis of C4 on C5 related to facet disease. Prevertebral soft tissues are normal. No fracture. IMPRESSION: Right frontal intra cerebral hemorrhage with overlying subarachnoid and subdural hemorrhage on the right. Thickness of subdural bleed 5 mm. No significant mass effect or midline shift. Right facial/orbital soft tissue swelling.  No underlying fracture. Cervical spondylosis.  No acute bony abnormality. Electronically Signed   By: Charlett Nose M.D.   On: 11/23/2015 09:35  Dg Chest Port 1 View  Result Date: 11/23/2015 CLINICAL DATA:  Unwitnessed fall at home this morning. Right facial injuries and right wrist pain. EXAM: PORTABLE CHEST 1 VIEW COMPARISON:  None. FINDINGS: Heart is mildly enlarged. No confluent airspace opacities or effusions. No acute bony abnormality. IMPRESSION: Mild cardiomegaly.  No active disease. Electronically Signed   By: Charlett Nose M.D.   On: 11/23/2015 08:18  Ct Maxillofacial Wo Contrast  Result Date: 11/23/2015 CLINICAL DATA:  Unwitnessed fall. Right facial swelling and bruising to right eye. EXAM: CT HEAD WITHOUT CONTRAST CT MAXILLOFACIAL WITHOUT CONTRAST CT CERVICAL SPINE WITHOUT CONTRAST TECHNIQUE: Multidetector CT imaging of the head, cervical spine, and maxillofacial structures were performed using the standard protocol without intravenous contrast. Multiplanar CT image reconstructions of the cervical spine and maxillofacial structures were also generated. COMPARISON:  None. FINDINGS: CT HEAD FINDINGS Intra cerebral hemorrhage is noted within the right frontal lobe. There likely is a small amount of subarachnoid blood  overlying the right frontal lobe as well. Small subdural hemorrhages noted over the right temporal lobe measuring approximately 5 mm in thickness. No midline shift. No hydrocephalus. No acute calvarial abnormality. CT MAXILLOFACIAL FINDINGS No evidence of facial or orbital fracture. Orbital soft tissues are unremarkable. Paranasal sinuses are clear. Soft tissue swelling over the right face and orbit. CT CERVICAL SPINE FINDINGS Degenerative disc and facet disease throughout the cervical spine. Slight anterolisthesis of C4 on C5 related to facet disease. Prevertebral soft tissues are normal. No fracture. IMPRESSION: Right frontal intra cerebral hemorrhage with overlying subarachnoid and subdural hemorrhage on the right. Thickness of subdural bleed 5 mm. No significant mass effect or midline shift. Right facial/orbital soft tissue swelling.  No underlying fracture. Cervical spondylosis.  No acute bony abnormality. Electronically Signed   By: Charlett Nose M.D.   On: 11/23/2015 09:35  Other results:  EKG: Normal sinus rhythm at 65 bpm, normal axis, normal intervals, no significant Q waves, no LVH by voltage, early R wave progression, no ST or T-wave changes. Today's ECG is unchanged from this ECG except for pseudonormalization of the T waves in aVL and V2. No other comparisons are available.  Assessment & Plan by Problem:  Ms. Kagarise is a 82 year old woman with a history of coronary artery disease status post stent placement 2 three years ago currently on aspirin and Plavix,  hypertension, and a history of significant falls 2 in the last 5 years resulting in a shoulder injury and a hip fracture who presents after a fall today. She was found to have a right Colles' fracture as well as a right frontal intracerebral hemorrhage with overlying subarachnoid and subdural hemorrhage on the right. She has a mild troponin leak but would not be a candidate for any anticoagulation because of her intracerebral bleed where  she to require a percutaneous coronary intervention. From a risk benefit standpoint it is best this be managed expectantly. The cause of the fall is also unclear but given the lack of symptoms immediately prior and the fact that she was able to press the medic alert button makes me feel this is more likely a mechanical fall rather than true syncope. After stent placement 3 years prior she likely no longer requires the Plavix and this need not be restarted upon discharge. Because of the risks associated with any neurosurgical procedure it is understandable to watch her medically to assure she does not progress from an bleeding standpoint. She also may require intervention for her right Colles' fracture.  1) Intracerebral hemorrhage: We'll follow serial neurologic exams and repeat brain imaging to assure no extension of the initial intra-cerebral hemorrhage. We are holding the aspirin and have discontinued the Plavix altogether.  2) Right Colles' fracture: We appreciate orthopedic surgery's input and any appropriate therapy they recommend.  3) Troponin leak: May be secondary to mild strain on her 82 year old heart with the trauma. She has underlying coronary artery disease but the troponin leak was mild. Because of the intracerebral hemorrhage she is not a candidate for any antiplatelet therapy or anticoagulation at this time and we will manage this expectantly. We will maintain telemetry to look for any possible arrhythmias that could have explained the fall.  4) Osteoporosis: She meets the definition of osteoporosis with a significant fracture at a fall from standing height. At some point in the future she may be a candidate for bisphosphonate therapy. In the acute setting we will hold off.  5) Disposition: Pending radiographic and clinical stability of her intracerebral hemorrhage. Her family, that includes her daughter and 2 sons, are debating whether or not she may move in with them temporarily or on a  more permanent basis. The patient has very strong feelings about this as well and likes her independence so this issue will need to be worked through by the family. I anticipate she will be in the hospital for at least another 24-72 hours to assure stability of her intracerebral hemorrhage and no other cardiac issues.  We are also awaiting a decision from Orthopedic Surgery on how they want to proceed, and this too could impact disposition.

## 2015-11-25 ENCOUNTER — Encounter (HOSPITAL_COMMUNITY): Payer: Self-pay | Admitting: Acute Care

## 2015-11-25 ENCOUNTER — Inpatient Hospital Stay (HOSPITAL_COMMUNITY): Payer: Medicare Other

## 2015-11-25 MED ORDER — METOPROLOL TARTRATE 25 MG PO TABS
25.0000 mg | ORAL_TABLET | Freq: Two times a day (BID) | ORAL | Status: DC
  Administered 2015-11-25 – 2015-11-29 (×8): 25 mg via ORAL
  Filled 2015-11-25 (×8): qty 1

## 2015-11-25 MED ORDER — PANTOPRAZOLE SODIUM 40 MG PO TBEC
40.0000 mg | DELAYED_RELEASE_TABLET | Freq: Every day | ORAL | Status: DC
Start: 2015-11-26 — End: 2015-11-29
  Administered 2015-11-26 – 2015-11-29 (×4): 40 mg via ORAL
  Filled 2015-11-25 (×4): qty 1

## 2015-11-25 NOTE — Progress Notes (Signed)
Looks good. No new issues or problems.  Awake, aware and oriented.Speech fluent. Motor intact.  No new rec's from my standpoint. I don't really see any great need for f/u imaging as it wouldn't change mgt.

## 2015-11-25 NOTE — Progress Notes (Signed)
Subjective: Feeling better, denies any pain. She needs some assistance with her feeding.  Objective:  Vital signs in last 24 hours: Vitals:   11/24/15 2345 11/25/15 0418 11/25/15 0741 11/25/15 1200  BP: (!) 113/45 (!) 111/37 (!) 135/58   Pulse: 60 72 99   Resp: 15 (!) 26 20   Temp: 98.3 F (36.8 C) 98.9 F (37.2 C) 99.4 F (37.4 C) (!) 101 F (38.3 C)  TempSrc: Oral Oral Oral   SpO2: 97% 95% 94%   Weight:      Height:       Physical Exam: General: Vital signs reviewed. Patient is elderly,in no acute distress and cooperative with exam.  Head: Large right sided ecchymosis with hematoma over right forehead extending over right orbit to right side of mouth. Edema and ecchymosis has improved as compare to before. Eyes: Right orbit edematous with large ecchymosis, patient is able to open little bit. Laceration below right orbit. Left PERRLA with EOMI intact. Mouth: Teeth are intact. .Normal tongue.Edema and ecchymosis over right side of lip. Cardiovascular: RRR, 3/6 blowing systolic murmur. Chest:Clear to auscultation bilaterally, no wheezes, rales, or rhonchi. Abdominal: Soft, non-tender, non-distended, BS +, no guarding present.  Extremities: No lower extremity edema bilaterally, pulses symmetric and intact bilaterally. Right wrist is in splint, and elevated.  Neurological: A&O x3, Strength grossly intact, no focal deficit.  DG Right Wrist: COMPARISON:  Postreduction films 11/23/2015. FINDINGS: The patient is in a plaster splint. Position and alignment of the patient's distal radius fracture is markedly improved compared to yesterday's examination. The patient's distal ulnar fracture also demonstrates marked improvement in position and alignment. No new abnormality. IMPRESSION: Marked improvement in position and alignment of distal radius and ulnar fractures. No new abnormality. Electronically Signed   By: Drusilla Kanner M.D.   On: 11/24/2015 18:47  CT Head Wo  Contrast: Study Result   CLINICAL DATA:  Subsequent encounter. SAH and SDH. Pt denies headache at this time. Pt has right eye pain and right eye swollen shut. EXAM: CT HEAD WITHOUT CONTRAST TECHNIQUE: Contiguous axial images were obtained from the base of the skull through the vertex without intravenous contrast. COMPARISON:  11/23/2015 and prior studies. FINDINGS: Parenchymal and extra-axial hemorrhage has evolved since the recent prior exam. The inferior right frontal lobe parenchymal hemorrhage/contusion is better defined. There is a mild decrease in dense hemorrhage along its lateral margin. The right-sided subdural hemorrhage has extended more posteriorly canal lying over the right parietal and temporal lobe extending along the right tentorium. A small amount of subdural hemorrhage persists along the anterior right temporal lobe. Areas of subarachnoid hemorrhage again noted above the right frontal lobe parenchymal hemorrhage. There several small areas of subarachnoid hemorrhage noted on the left, with 2 noted at the right vertex. Hemorrhage is now seen said laying dependently in the occipital horns of the lateral ventricles. There is no evidence of new hemorrhage. There is no significant mass effect or midline shift. Ventricles are normal in size for this patient's age and normal in configuration. No hydrocephalus. IMPRESSION: 1. Parenchymal, subarachnoid and subdural hemorrhage has evolved as detailed, but there is no evidence of new intracranial hemorrhage. 2. No hydrocephalus.  No significant mass effect or midline shift. Electronically Signed   By: Amie Portland M.D.   On: 11/25/2015 14:33    Assessment/Plan:  Ms. Rankin is a 82 yo female with PMHx of HTN, CAD s/p stent placement x 2 3 years ago, and history of 2 falls over last  5 years, who presents after a fall.She was on Aspirin and Plavix.  Cerebral Hemorrhage with Subdural Hematoma and Subarachnoid  Hemorrhage: Secondary to fall at home. Repeat CT today shows  Parenchymal, subarachnoid and subdural hemorrhage has evolved as detailed, but there is no evidence of new intracranial hemorrhage. 2. No hydrocephalus.  No significant mass effect or midline shift.  . Dr. Jordan Likes with Neurosurgery recommend no more images and conservative management due to her age and other comorbidies. Patient is awake, alert and oriented x 3.No evidence of neurological deficits on exam -Dc telemetry and can move to med surge. -Hold Plavix and Aspirin, She might not need plavix any more as her stent placement was done 3 years ago. -Morphine 2 mg IV Q3H prn pain -Zofran 4 mg Q6H prn nausea  Displaced Right Radial and Ulnar Wrist  Fracture: Orthopedic was able to do a closed reduction with acceptable results on subsequent x-rays, There recommend repeat xray with splint after one week. -Morphine 2 mg IV Q3H prn pain -flexeril for some muscle spasms in that hand.  Chest Pain: Transient mid-sternal dull achey chest pain after arrival to ED which resolved. Patient was given nitroglycerin. Patient does have a history of CAD, . Incresed in trendingTroponin 0.03,0.18  0.10 w no evidence of ischemic changes on EKG. No evidence of rib fracture on CXR. Might be due to transient strain on her heart with fall. She don't have any clinical signs of ongoing ischemia.Was mildly tachycardic during morning rounds. We will resume her metoprolol but No ASA/Plavix due to cerebral hemorrhage.  CAD s/p Stent Placement: Patient follows with Dr. Kirtland Bouchard" in [place]. She is on plavix 75 mg daily, metoprolol 25 mg BID, atorvastatin 80 mg daily, lisinopril 10 mg daily at home. Stent placement was greater than 3 years ago per patient. -Holding plavix/ASA due to bleed -Continue atorvastatin 80 mg daily -Start her home metoprolol 25 mg BID.  History of Falls: This patient's 3rd fall in the last 3 years with prior hip fracture and shoulder injury.  Patient lives by herself at home and uses life[employer]. She ambulates with a walker and can complete all of her ADLs independently. Son lives 0.5 miles away. Family is concerned about her safety and they are planning if she can move with any of her 3 kids. In the past patient was hesitant and wants to stay independent. -PT/OT  HTN: Normotensive. Target BP 160/90.Her BP stay b/w 111/37 - 113/45. Patient is on lisinopril 10 mg daily, Lasix 20 mg daily, Metoprolol 25 mg BID and Hydralazine 25 mg TID at home.  -Holding for now   Depression: Patient is on citalopram 10 mg daily at home.  -Continue Citalopram  Dispo: Anticipated discharge in approximately 1-2day(s) .   LOS: 2 days   Arnetha Courser, MD 11/25/2015, 3:03 PM Pager: [phone]

## 2015-11-25 NOTE — Progress Notes (Signed)
OT Cancellation Note  Patient Details Name: Elizabeth Barry MRN: [id number] DOB: [birth year]   Cancelled Treatment:    Reason Eval/Treat Not Completed: Patient not medically ready (active bedrest orders). Will initiate OT eval with updated activity orders or d/c of bedrest orders.   Gaye Alken M.S., OTR/L Pager: [phone]  11/25/2015, 12:11 PM

## 2015-11-25 NOTE — Progress Notes (Signed)
Internal Medicine Attending  Date: 11/25/2015  Patient name: Elizabeth Barry Medical record number: [id number] Date of birth: [birth year] Age: 82 y.o. Gender: female  I saw and evaluated the patient. I reviewed the resident's note by Dr. Nelson Chimes and I agree with the resident's findings and plans as documented in her progress note.  Elizabeth Barry is clinically stable with decreasing pain. Her Colles' fracture has been successfully reduced and will be reassessed by orthopedic surgery in 1 week. Neurosurgery feels her course has been stable. Repeat imaging of the brain shows no new bleed and the expected evolution. We will ask the family to make a decision on where she will be discharged. I anticipate she will be discharged home tomorrow.

## 2015-11-25 NOTE — Progress Notes (Signed)
Metoprolol held due to 134/52. HR 65, irregular heart sounds S1, S2. Patient given tylenol 650 mg PO. RN willl reassess and  continue to monitor.  Patients BP 142/53 HR 78. RN paged on call MD. Per Schorr ok to hold metoprolol, continue to monitor and give if needed. RN will continue to monitor.

## 2015-11-25 NOTE — Progress Notes (Signed)
Pt arrived to 5M01 via bed.  Alert and oriented, in no apparent distress, no complaints of pain.  VSS.  Will continue to monitor.  Sondra Come, RN

## 2015-11-26 ENCOUNTER — Inpatient Hospital Stay (HOSPITAL_COMMUNITY): Payer: Medicare Other

## 2015-11-26 ENCOUNTER — Encounter (HOSPITAL_COMMUNITY): Payer: Self-pay | Admitting: *Deleted

## 2015-11-26 MED ORDER — POLYETHYLENE GLYCOL 3350 17 G PO PACK: 17.0000 g | PACK | Freq: Every day | ORAL | 0 refills | Status: AC | PRN

## 2015-11-26 MED ORDER — OXYCODONE-ACETAMINOPHEN 5-325 MG PO TABS: 1.0000 | ORAL_TABLET | ORAL | 0 refills | Status: AC | PRN

## 2015-11-26 NOTE — Progress Notes (Signed)
Internal Medicine Attending  Date: 11/26/2015  Patient name: Elizabeth Barry Medical record number: [id number] Date of birth: [birth year] Age: 82 y.o. Gender: female  I saw and evaluated the patient. I reviewed the resident's note by Dr. Nelson Chimes and I agree with the resident's findings and plans as documented in her progress note.  When seen on rounds this morning Elizabeth Barry had no acute complaints. She was subsequently evaluated by physical therapy who recommends short-term rehabilitation in order to return home independently. We are now in the process of trying to secure a skilled nursing facility bed. I'm hopeful one will be found in the very near future and she will be able to be discharged within the next 24 hours.

## 2015-11-26 NOTE — Discharge Instructions (Signed)
Please only take the following medications: Celexa 10 mg once a day Lipitor 80 mg once a day Metoprolol 25 mg twice a day Miralax 17 grams daily as needed for constipation Oxycodone 1 pill every 4 hours as needed for severe pain  Please do not take other medications (especially do not take Aspirin or Plavix at this time)  Please follow up with your primary care doctor and your orthopedic surgeon as listed in this packet

## 2015-11-26 NOTE — Care Management Important Message (Signed)
Important Message  Patient Details  Name: Elizabeth Barry MRN: [id number] Date of Birth: [birth year]   Medicare Important Message Given:  Yes    [name] 11/26/2015, 10:41 AM

## 2015-11-26 NOTE — Progress Notes (Signed)
Paged regarding confusion.  Evaluate patient at bedside.  Daughter, son, and daughter-in-law present and say that Ms Haslip has been more confused and sleepier this afternoon than at any time since she's been in the hospital, e.g. thinking her daughter is making okra in the room.  Also a new cough.  Pt reclined in her chair with eyes closed, calm and in no distress, easily arousable to voice and touch.  Afebrile, normotensive, sat 88% on RA (previously 90s).  Heart RRR, normal S1 S2, lungs CTAB with decreased breath sounds bilateral bases.  AO x 1 (self).  Neuro exam nonfocal, with intact cranial nerves and strength/sensation grossly intact in all extremities (exam limited by injuries to R arm).  Mental status change in elderly woman with known intracranial bleeds.  She has been stable with normal mental status previously in this hospitalization.  Delerium vs PNA vs evolving intracranial hemorrhage.  Will obtain CXR and non-contrast head CT.

## 2015-11-26 NOTE — Progress Notes (Signed)
Subjective: Feeling better, denies any pain. Was sitting in her chair eating breakfast. She was not sure where she is going after discharge today, will wait for her kids to make a final decision today.  Objective:  Vital signs in last 24 hours: Vitals:   11/25/15 2330 11/26/15 0141 11/26/15 0632 11/26/15 0930  BP: (!) 142/53 (!) 139/48 (!) 147/47 (!) 119/43  Pulse: 82 70 70 72  Resp:    20  Temp:  98.1 F (36.7 C) 98.1 F (36.7 C) 98 F (36.7 C)  TempSrc:  Oral Oral Oral  SpO2: 97% 97% 96% 93%  Weight:      Height:       Physical Exam: General: Vital signs reviewed. Patient is elderly,in no acute distress, sitting in chair. Head: Large right sided ecchymosis with hematoma over right forehead extending over right orbit to right side of mouth. Edema and ecchymosis keep improving. Eyes:Right orbit edematous with large ecchymosis, patient is able to open little bit.Laceration below right orbit. Left PERRLA with EOMI intact. Mouth: Teeth are intact. .Normal tongue.Edema and ecchymosis over right side of lip. Cardiovascular:RRR,3/6 blowing systolic murmur. Chest:Clear to auscultation bilaterally, no wheezes, rales, or rhonchi. Abdominal: Soft, non-tender, non-distended, BS +, no guarding present.  Extremities: No lower extremity edema bilaterally, pulses symmetric and intact bilaterally. Right wrist is in splint, and elevated. Neurological: A&O x3, Strength grossly intact, no focal deficit.  Assessment/Plan: Ms. Veltri is a 82 yo female with PMHx of HTN, CAD s/p stent placement x 2 3 years ago,and history of 2 falls over last 5 years,who presents after a fall.She was on Aspirin and Plavix.  Cerebral Hemorrhage with Subdural Hematoma and Subarachnoid Hemorrhage: Secondary to fall at home. Repeat CT yesterday was negative for any new bleed.  .  No hydrocephalus. No significant mass effect or midline shift.  . Dr. Jordan Likes with Neurosurgery recommend no more images and  conservative management due to her age and other comorbidies. Patient is awake, alert and oriented x 3.No evidence of neurological deficits on exam --Keep Holding Plavix and Aspirin, She might not need plavix any more as her stent placement was done 3 years ago.  Displaced Right Radial and Ulnar Wrist  Fracture: Orthopedic was able to do a closed reduction with acceptable results on subsequent x-rays, There recommend repeat xray with splint after one week.   Chest Pain: Transient mid-sternal dull achey chest pain after arrival to ED which resolved. Patient was given nitroglycerin. Patient does have a history of CAD, . Incresed in trendingTroponin 0.03,0.18 0.10 wno evidence of ischemic changes on EKG. No evidence of rib fracture on CXR. Might be due to transient strain on her heart with fall. She don't have any clinical signs of ongoing ischemia.  CAD s/p Stent Placement: Patient follows with Dr. Kirtland Bouchard" in [place]. She is on plavix 75 mg daily, metoprolol 25 mg BID, atorvastatin 80 mg daily, lisinopril 10 mg daily at home. Stent placement was greater than 3 years ago per patient. -Holding plavix/ASA due to bleed -Continue atorvastatin 80 mg daily -Start her home metoprolol 25 mg BID.  History of Falls: This patient's 3rd fall in the last 3 years with prior hip fracture and shoulder injury. Patient lives by herself at home and uses [employer]. She ambulates with a walker and can complete all of her ADLs independently. Son lives 0.5 miles away. Family is concerned about her safety and they are planning if she can move with any of her 3 kids. In the  past patient was hesitant and wants to stay independent. -PT/OT  HTN: Normotensive. Target BP 160/90.Her BP stay b/w 111/37 - 113/45.Patient is on lisinopril 10 mg daily, Lasix 20 mg daily, Metoprolol 25 mg BID and Hydralazine 25 mg TID at home.  -Holding for now   Depression: Patient is on citalopram 10 mg daily at home. Continue Citalopram  Dispo:  Discharge today.   LOS: 3 days   Arnetha Courser, MD 11/26/2015, 11:20 AM Pager: [phone]

## 2015-11-26 NOTE — Clinical Social Work Note (Signed)
Clinical Social Work Assessment  Patient Details  Name: Elizabeth Barry MRN: [id number] Date of Birth: 1922-05-12  Date of referral:  11/26/15               Reason for consult:  Facility Placement                Permission sought to share information with:  Facility Medical sales representative, Family Supports Permission granted to share information::  Yes, Verbal Permission Granted  Name::     Tressia Danas  [phone]  or [place], [place]   [phone]   Agency::  SNF admissions  Relationship::     Contact Information:     Housing/Transportation Living arrangements for the past 2 months:  Single Family Home Source of Information:  Patient, Adult Children Patient Interpreter Needed:  None Criminal Activity/Legal Involvement Pertinent to Current Situation/Hospitalization:  No - Comment as needed Significant Relationships:  Adult Children Lives with:  Self Do you feel safe going back to the place where you live?  No Need for family participation in patient care:  Yes (Comment) (Patient has some confusion at times.)  Care giving concerns:  Patient lives alone and family feel she needs some short term rehab before she is able to return back home.   Social Worker assessment / plan:  Patient is a 82 year old female who lives alone, patient was lethargic and did not want to speak much, CSW completed assessment by speaking with patient's son.  Patient's son states she lives alone, and she will need short term rehab before she is able to go back home.  Patient has been in rehab before per family's report she has been at [facility] and would like to return.  Patient's son was explained process of SNF placement and what to expect at rehab.  Patients's son did not express any other questions or concerns.  Employment status:  Retired Health and safety inspector:  Medicare PT Recommendations:  Skilled Nursing Facility Information / Referral to community resources:  Skilled Nursing  Facility  Patient/Family's Response to care:  Patient and her family are in agreement about going to SNF for short term rehab.  Patient/Family's Understanding of and Emotional Response to Diagnosis, Current Treatment, and Prognosis:  Patient's family is aware of patient's condition and are supportive of her going to SNF so she can gain her strength back.  Patient's family is hopeful she will be able to return back home, but are open to looking at other options if needed.  Emotional Assessment Appearance:  Appears stated age Attitude/Demeanor/Rapport:    Affect (typically observed):  Appropriate, Calm Orientation:  Oriented to Self, Oriented to  Time, Oriented to Situation, Oriented to Place Alcohol / Substance use:  Not Applicable Psych involvement (Current and /or in the community):  No (Comment)  Discharge Needs  Concerns to be addressed:  No discharge needs identified Readmission within the last 30 days:  No Current discharge risk:  Lack of support system, Lives alone Barriers to Discharge:  No Barriers Identified   [place] Constable 11/26/2015, 5:28 PM

## 2015-11-26 NOTE — NC FL2 (Signed)
[place] MEDICAID FL2 LEVEL OF CARE SCREENING TOOL     IDENTIFICATION  Patient Name: Elizabeth Barry Birthdate: 05/10/1922 Sex: female Admission Date (Current Location): 11/23/2015  [facility] and [place] Number:  [employer] and Address:  [facility], [address] [place], [place] [address]      Provider Number: [id number]  Attending Physician Name and Address:  Doneen Poisson, MD  Relative Name and Phone Number:     Tressia Danas  [phone]   [name], [name]   [phone]      Current Level of Care: Hospital Recommended Level of Care: Skilled Nursing Facility Prior Approval Number:    Date Approved/Denied:   PASRR Number:    Discharge Plan: SNF    Current Diagnoses: Patient Active Problem List   Diagnosis Date Noted  . Fall at home   . Closed fracture of distal end of right radius with ulna   . Cerebral contusion (HCC)   . Cerebral hemorrhage (HCC) 11/23/2015  . Subarachnoid hemorrhage (HCC) 11/23/2015  . Subdural hematoma (HCC) 11/23/2015  . HTN (hypertension) 11/23/2015  . CAD (coronary artery disease) 11/23/2015  . S/P arterial stent 11/23/2015  . Radial fracture 11/23/2015  . Right distal ulnar fracture 11/23/2015  . Orbital fracture (HCC) 11/23/2015    Orientation RESPIRATION BLADDER Height & Weight     Time, Situation, Place, Self  Normal Continent Weight: 109 lb (49.4 kg) Height:  5' (152.4 cm)  BEHAVIORAL SYMPTOMS/MOOD NEUROLOGICAL BOWEL NUTRITION STATUS      Continent Diet (Regular)  AMBULATORY STATUS COMMUNICATION OF NEEDS Skin   Limited Assist Verbally Surgical wounds                       Personal Care Assistance Level of Assistance  Dressing, Bathing Bathing Assistance: Limited assistance   Dressing Assistance: Limited assistance     Functional Limitations Info  Sight, Speech Sight Info: Adequate Hearing Info: Adequate Speech Info: Adequate    SPECIAL CARE FACTORS FREQUENCY  PT (By  licensed PT), OT (By licensed OT)     PT Frequency: 5x a week OT Frequency: 5x a week            Contractures Contractures Info: Not present    Additional Factors Info  Code Status, Psychotropic      Allergies: Code Status Info: Full Code   Psychotropic Info: citalopram (CELEXA) tablet 10 mg   CONTRAST MEDIA [IODINATED DIAGNOSTIC AGENTS]         Current Medications (11/26/2015):  This is the current hospital active medication list Current Facility-Administered Medications  Medication Dose Route Frequency Provider Last Rate Last Dose  . acetaminophen (TYLENOL) tablet 650 mg  650 mg Oral Q6H PRN Servando Snare, MD   650 mg at 11/25/15 2327   Or  . acetaminophen (TYLENOL) suppository 650 mg  650 mg Rectal Q6H PRN Alexa Lucrezia Starch, MD      . atorvastatin (LIPITOR) tablet 80 mg  80 mg Oral q1800 Alexa Lucrezia Starch, MD   80 mg at 11/25/15 1736  . citalopram (CELEXA) tablet 10 mg  10 mg Oral Daily Alexa Lucrezia Starch, MD   10 mg at 11/26/15 0918  . cyclobenzaprine (FLEXERIL) tablet 5 mg  5 mg Oral TID PRN Su Hoff, MD   5 mg at 11/26/15 0918  . latanoprost (XALATAN) 0.005 % ophthalmic solution 1 drop  1 drop Left Eye QHS Alexa Lucrezia Starch, MD   1 drop at  11/24/15 2204  . metoprolol tartrate (LOPRESSOR) tablet 25 mg  25 mg Oral BID Su Hoff, MD   25 mg at 11/26/15 0918  . morphine 2 MG/ML injection 2 mg  2 mg Intravenous Q3H PRN Alexa Lucrezia Starch, MD   2 mg at 11/24/15 1646  . nitroGLYCERIN (NITROSTAT) SL tablet 0.4 mg  0.4 mg Sublingual Q5 min PRN Dione Booze, MD      . ondansetron [facility]) tablet 4 mg  4 mg Oral Q6H PRN Alexa Lucrezia Starch, MD       Or  . ondansetron (ZOFRAN) injection 4 mg  4 mg Intravenous Q6H PRN Alexa Lucrezia Starch, MD      . oxyCODONE-acetaminophen (PERCOCET/ROXICET) 5-325 MG per tablet 1-2 tablet  1-2 tablet Oral Q4H PRN Su Hoff, MD   1 tablet at 11/26/15 0708  . pantoprazole (PROTONIX) EC tablet 40 mg  40 mg Oral Daily Roma Kayser Schorr, NP   40 mg at 11/26/15 0918  .  polyethylene glycol (MIRALAX / GLYCOLAX) packet 17 g  17 g Oral Daily PRN Alexa R Burns, MD      . sodium chloride flush (NS) 0.9 % injection 3 mL  3 mL Intravenous Q12H Alexa Lucrezia Starch, MD   3 mL at 11/26/15 0919     Discharge Medications: Please see discharge summary for a list of discharge medications.  Relevant Imaging Results:  Relevant Lab Results:   Additional Information SSN [id number]  Darleene Cleaver, [place]

## 2015-11-26 NOTE — Progress Notes (Signed)
OT NOTE  Pt is Medicare and current D/C plan is SNF. No apparent immediate acute care OT needs, therefore will defer OT to SNF. If OT eval is needed please call Acute Rehab Dept. at [phone] or text page OT at [phone].    Laiya Wisby, [place]   OTR/L Pager: [phone] Office: [phone] .   

## 2015-11-26 NOTE — Progress Notes (Signed)
Patient's family called nurse in room at 1900 and voiced concern about patient's mentation. Assessed and noted that patient is more disoriented than earlier. Not able to tell where she is and falling asleep easily. MD notified and came to assess patient. Oncoming nurse made aware of condition and in room when MD arrived to assess patient.

## 2015-11-26 NOTE — Evaluation (Signed)
Physical Therapy Evaluation Patient Details Name: Elizabeth Barry MRN: [id number] DOB: 12-20-1922 Today's Date: 11/26/2015   History of Present Illness  pt presents with recent fall sustaining R distal radius and ulna fxs, SDH, SAH, and cerebral hemorrhage.  pt with hx of HTN and CAD.    Clinical Impression  Pt generally unsteady with all mobility and at this time requires A for all mobility and safety.  Feel pt would benefit from SNF level of care to allow continued therapy and to maximize pt's independence and decrease overall burden of care.  Will continue to follow while on acute.      Follow Up Recommendations SNF    Equipment Recommendations   (R Platform for RW if MD places order allowing weightbearing.)    Recommendations for Other Services       Precautions / Restrictions Precautions Precautions: Fall Precaution Comments: pt has mission sling style of sling on R UE and traditional sling sitting on side table.  Placed pt in traditional sling.   Required Braces or Orthoses: Sling Restrictions Other Position/Activity Restrictions: No orders or progress note indicating weightbearing status, so maintained R wrist NWBing.        Mobility  Bed Mobility               General bed mobility comments: pt sitting in recliner on arrival.    Transfers Overall transfer level: Needs assistance Equipment used: Right platform walker Transfers: Sit to/from Stand Sit to Stand: Min assist         General transfer comment: A with power up to standing and mostly balance.  cues for UE use and pt tends to try to grab RW with L UE to pull up.  Also cues needed for positioning of R UE during transfers.    Ambulation/Gait Ambulation/Gait assistance: Mod assist Ambulation Distance (Feet): 12 Feet (x2) Assistive device: Right platform walker Gait Pattern/deviations: Step-through pattern;Decreased stride length     General Gait Details: pt mvoes very slowly and needs A for management of  PFRW and for balance.  pt indicates fatigue after only minimal ambulation and had a sitting rest break on toilet in between bouts of gait.    Stairs            Wheelchair Mobility    Modified Rankin (Stroke Patients Only)       Balance Overall balance assessment: Needs assistance;History of Falls Sitting-balance support: No upper extremity supported;Feet supported Sitting balance-Leahy Scale: Fair     Standing balance support: Bilateral upper extremity supported;During functional activity Standing balance-Leahy Scale: Poor                               Pertinent Vitals/Pain Pain Assessment: 0-10 Pain Score: 5  Pain Location: R wrist and forearm Pain Descriptors / Indicators: Aching;Grimacing Pain Intervention(s): Monitored during session;Premedicated before session;Repositioned    Home Living Family/patient expects to be discharged to:: Private residence Living Arrangements: Alone Available Help at Discharge: Family;Available 24 hours/day Type of Home: House Home Access: Stairs to enter         Additional Comments: pt and family are unsure if pt will D/C to her home and have family stay with her or if she will go to one of her children's homes at D/C.  Either way pt will be living on the first floor and have some steps to enter the home.      Prior Function Level of Independence: Needs  assistance   Gait / Transfers Assistance Needed: Uses a RW for ambulation.  Indicates her R knee buckles at times.   ADL's / Homemaking Assistance Needed: pt performs her own ADLs and family does A with homemaking tasks and driving.          Hand Dominance        Extremity/Trunk Assessment   Upper Extremity Assessment: Defer to OT evaluation           Lower Extremity Assessment: Generalized weakness      Cervical / Trunk Assessment: Kyphotic  Communication   Communication: No difficulties  Cognition Arousal/Alertness: Awake/alert Behavior During  Therapy: WFL for tasks assessed/performed Overall Cognitive Status: Within Functional Limits for tasks assessed                      General Comments      Exercises        Assessment/Plan    PT Assessment Patient needs continued PT services  PT Diagnosis Difficulty walking;Generalized weakness   PT Problem List Decreased strength;Decreased activity tolerance;Decreased balance;Decreased mobility;Decreased coordination;Decreased knowledge of use of DME;Pain  PT Treatment Interventions DME instruction;Gait training;Stair training;Functional mobility training;Therapeutic activities;Therapeutic exercise;Balance training;Neuromuscular re-education;Patient/family education   PT Goals (Current goals can be found in the Care Plan section) Acute Rehab PT Goals Patient Stated Goal: Per pt to maintain independence. PT Goal Formulation: With patient/family Time For Goal Achievement: 12/10/15 Potential to Achieve Goals: Good    Frequency Min 3X/week   Barriers to discharge        Co-evaluation               End of Session Equipment Utilized During Treatment: Gait belt Activity Tolerance: Patient limited by fatigue Patient left: in chair;with call bell/phone within reach;with chair alarm set Nurse Communication: Mobility status         Time: 9678-9381 PT Time Calculation (min) (ACUTE ONLY): 29 min   Charges:   PT Evaluation $PT Eval Moderate Complexity: 1 Procedure PT Treatments $Gait Training: 8-22 mins   PT G CodesSunny Schlein, [place] [phone] 11/26/2015, 11:21 AM

## 2015-11-26 NOTE — Clinical Social Work Placement (Signed)
   CLINICAL SOCIAL WORK PLACEMENT  NOTE  Date:  11/26/2015  Patient Details  Name: Elizabeth Barry MRN: [id number] Date of Birth: [birth year]  Clinical Social Work is seeking post-discharge placement for this patient at the Skilled  Nursing Facility level of care (*CSW will initial, date and re-position this form in  chart as items are completed):  Yes   Patient/family provided with [facility] Clinical Social Work Department's list of facilities offering this level of care within the geographic area requested by the patient (or if unable, by the patient's family).      Patient/family informed of their freedom to choose among providers that offer the needed level of care, that participate in Medicare, Medicaid or managed care program needed by the patient, have an available bed and are willing to accept the patient.  Yes   Patient/family informed of [facility] ownership interest in [facility] and [facility], as well as of the fact that they are under no obligation to receive care at these facilities.  PASRR submitted to EDS on 11/26/15     PASRR number received on       Existing PASRR number confirmed on       FL2 transmitted to all facilities in geographic area requested by pt/family on 11/26/15     FL2 transmitted to all facilities within larger geographic area on       Patient informed that his/her managed care company has contracts with or will negotiate with certain facilities, including the following:        Yes   Patient/family informed of bed offers received.  Patient chooses bed at [facility], [facility]     Physician recommends and patient chooses bed at      Patient to be transferred to [facility],  on  .  Patient to be transferred to facility by       Patient family notified on   of transfer.  Name of family member notified:        PHYSICIAN Please sign FL2     Additional Comment:    _______________________________________________ Darleene Cleaver,  LCSWA 11/26/2015, 5:41 PM

## 2015-11-26 NOTE — Care Management Note (Signed)
Case Management Note  Patient Details  Name: Elizabeth Barry MRN: [id number] Date of Birth: 05/14/1922  Subjective/Objective:   Pt in with subdural hematoma after a fall. She is from home alone.                  Action/Plan: Pt recommending SNF. CM spoke to the patients son and they are interested in SNF Rehab also. CSW consulted. CM continuing to follow for discharge needs.   Expected Discharge Date:                  Expected Discharge Plan:  Skilled Nursing Facility  In-House Referral:  Clinical Social Work  Discharge planning Services  CM Consult  Post Acute Care Choice:    Choice offered to:     DME Arranged:    DME Agency:     HH Arranged:    HH Agency:     Status of Service:  In process, will continue to follow  If discussed at Long Length of Stay Meetings, dates discussed:    Additional Comments:  Kermit Balo, RN 11/26/2015, 2:50 PM

## 2015-11-26 NOTE — Discharge Summary (Signed)
Name: Elizabeth Barry MRN: [id number] DOB: 05/03/1922 82 y.o. PCP: Elizabeth Party, NP  Date of Admission: 11/23/2015  5:27 AM Date of Discharge: 11/29/2015 Attending Physician: Elizabeth Poisson, MD  Discharge Diagnosis: 1. Cerebral Hemorrhage with Subdural Hematoma and Subarachnoid Hemorrhage 2.Right Colles' fracture 3. Osteoporosis 4.Troponin leak 5.CAD s/p Stent Placement 6.HTN:  Discharge Medications:   Medication List    STOP taking these medications   clopidogrel 75 MG tablet Commonly known as:  PLAVIX   furosemide 20 MG tablet Commonly known as:  LASIX   hydrALAZINE 25 MG tablet Commonly known as:  APRESOLINE   lisinopril 10 MG tablet Commonly known as:  PRINIVIL,ZESTRIL     TAKE these medications   atorvastatin 80 MG tablet Commonly known as:  LIPITOR Take 80 mg by mouth daily.   bimatoprost 0.01 % Soln Commonly known as:  LUMIGAN Place 1 drop into both eyes at bedtime.   citalopram 10 MG tablet Commonly known as:  CELEXA Take 10 mg by mouth daily.   dextromethorphan-guaiFENesin 30-600 MG 12hr tablet Commonly known as:  MUCINEX DM Take 1 tablet by mouth 2 (two) times daily.   metoprolol tartrate 25 MG tablet Commonly known as:  LOPRESSOR Take 25 mg by mouth 2 (two) times daily.   oxyCODONE-acetaminophen 5-325 MG tablet Commonly known as:  PERCOCET/ROXICET Take 1 tablet by mouth every 4 (four) hours as needed for moderate pain.   polyethylene glycol packet Commonly known as:  MIRALAX / GLYCOLAX Take 17 g by mouth daily as needed for mild constipation.   SIMBRINZA 1-0.2 % Susp Generic drug:  Brinzolamide-Brimonidine Place 1 drop into both eyes 2 (two) times daily.       Disposition and follow-up:   Ms.Elizabeth Barry was discharged from [facility] in Stable condition.  At the hospital follow up visit please address:  1.  Her antihypertensives as we are currently holding them as she was normotensive during her stay at hospital with  out them.We just restarted her metoprolol due to her Hx of CAD.  We are holding her Asprin and Plavix due to her recent intracerebral bleed. Aspirin can be restarted in few weeks but we suggest stop Plavix as her stent placement was 3 years ago and she had H/O multiple major falls to prevent any future major bleed.  2.  Labs / imaging needed at time of follow-up: CBC and X Ray of her right Wrist after one week of initial reduction done on 11/24/15.  3.  Pending labs/ test needing follow-up: None  Follow-up Appointments: Follow-up Information    Elizabeth C, MD Follow up in 1 week(s).   Specialty:  Orthopedic Surgery Why:  see dr. Ophelia Barry in one week for xray to check right wrist fracture position Contact information: [address] [place] [address] [phone]        [facility], NP Follow up on 12/02/2015.   Specialty:  Internal Medicine Why:  11:00 am Contact information: [address] [place] [address] [phone]           Hospital Course by problem list:Ms. Elizabeth Barry is a 82 yo female with PMHx of HTN, CAD s/p stent placement x 2, and history of falls causing shoulder injury and hip fracture in the last 5 years who presents after a fall.Patient lives on her own in [place]. On 11/23/15, around 430 am, patient awoke from sleep feeling cold. She ambulated to the other room with her walker where the thermostat was. When  in the other room, the patient fell to the floor hitting her head. She cannot remember how the fall happened or if she tripped. She denies any symptoms such as lightheadedness, confusion, weakness, chest pain, shortness of breath or heart palpitations prior to the fall. She was in her normal state of healthy yesterday and denied any recent fever, chills, cough or dysuria. Patient was able to press her life alert button to call for EMS.  She was found to have a right Colles' fracture as well as a right frontal intracerebral hemorrhage with  overlying subarachnoid and subdural hemorrhage on the right. She has a mild troponin leak may be secondary to mild strain on her  heart with the trauma but would not be a candidate for any anticoagulation because of her intracerebral bleed nor any coronary invention can be done at this point. We managed it expectantly.  Cerebral Hemorrhage with Subdural Hematoma and Subarachnoid Hemorrhage: Secondary to fall at home. Repeat CT on 11/25/2015 was negative for any new bleed.No hydrocephalus. No significant mass effect or midline shift. . Dr. Jordan Barry with Neurosurgery recommend no more images and conservative management due to her age and other comorbidies. Patient is awake, alert and oriented x 3.No evidence of neurological deficits on exam --Keep Holding Plavix and Aspirin, She might not need plavix any more as her stent placement was done 3 years ago. Aspirin can be started after few weeks. She will benefit with from PT, OT and respiratory therapist.  .Right Colles' fracture: Orthopedic was able to do a closed reduction with acceptable results on subsequent x-rays, There recommend repeat xray with splint after one week.   Troponin leak: She has a mild troponin leak may be secondary to mild strain on her  heart with the trauma but would not be a candidate for any anticoagulation because of her intracerebral bleed nor any coronary invention can be done at this point. We managed it expectantly. Her trending troponin was 0.03   0.18  0.10 with no evidence of ischemic changes on EKG.  Cough: She developed this productive cough with out any other signs of infection, being afebrile, no leukocytosis. Denies any SOB, maintaining her O2 sat. On room air.She was having decreased air entry on auscultation, most likely due her being immobile causing temporary alveolar collapse.She was not using her spirometer either.We recommend chest PT with frequently blowing in her spirometer. We also gave her mucinex DM to help.Her  air entry improved next day , she still have cough but much less in intensity. -Continue chest PT and frequent use of spirometer.  CAD s/p Stent Placement: Patient follows with Dr. Kirtland Bouchard" in [place]. She is on plavix 75 mg daily, metoprolol 25 mg BID, atorvastatin 80 mg daily, lisinopril 10 mg daily at home. Stent placement was greater than 3 years ago per patient. -Holding plavix/ASA due to bleed -Continue atorvastatin 80 mg daily -Start her home metoprolol 25 mg BID. - Need a follow up with her cardiologist once recovered from her acute injuries regarding restarting her anticoagulation.    Osteoporosis: She meets the definition of osteoporosis with a significant fracture at a fall from standing height. At some point in the future she may be a candidate for bisphosphonate therapy. In the acute setting we will hold off.  HTN: Normotensive during her stay in hospital with out her home meds. Of lisinopril 10 mg daily, Lasix 20 mg daily, Metoprolol 25 mg BID and Hydralazine 25 mg TID  We restarted the metoprolol due to  her Hx of CAD. She needs a reassessment for rest of her antihypertensive meds. By her PCP.  Discharge Vitals:   BP (!) 145/87 (BP Location: Left Arm)   Pulse 96   Temp 99.7 F (37.6 Barry) (Oral)   Resp 18   Ht 5' (1.524 m)   Wt 109 lb (49.4 kg)   SpO2 95%   BMI 21.29 kg/m    Physical Exam: General: Vital signs reviewed. Patient is elderly,in no acute distress, sitting in chair. Head: Large right sided ecchymosis with hematoma over right forehead extending over right orbit to right side of mouth. Edema and ecchymosis keep improving. Eyes:Right orbit edematous with large ecchymosis, patient is able to open little bit.Laceration below right orbit. Left PERRLA with EOMI intact. Mouth: Teeth are intact. .Normal tongue.Edema and ecchymosis over right side of lip. Cardiovascular:RRR,3/6 blowing systolic murmur. Chest:Clear to auscultation bilaterally, no wheezes, rales, or  rhonchi. Abdominal:Soft, non-tender, non-distended, BS +, no guarding present.  Extremities: No lower extremity edema bilaterally, pulses symmetric and intact bilaterally. Right wrist is in splint, and elevated. Neurological: A&O x3, Strength grossly intact, no focal deficit.  Pertinent Labs, Studies, and Procedures:  CBC Latest Ref Rng & Units 11/24/2015 11/23/2015  WBC 4.0 - 10.5 K/uL 8.4 8.2  Hemoglobin 12.0 - 15.0 g/dL 10.4(L) 11.2(L)  Hematocrit 36.0 - 46.0 % 34.3(L) 36.0  Platelets 150 - 400 K/uL 134(L) 140(L)   BMP Latest Ref Rng & Units 11/24/2015 11/23/2015  Glucose 65 - 99 mg/dL 83 161(W)  BUN 6 - 20 mg/dL 17 19  Creatinine 9.60 - 1.00 mg/dL 4.54 0.98  Sodium 119 - 145 mmol/L 139 137  Potassium 3.5 - 5.1 mmol/L 3.7 3.9  Chloride 101 - 111 mmol/L 112(H) 107  CO2 22 - 32 mmol/L 22 23  Calcium 8.9 - 10.3 mg/dL 1.4(N) 8.2(N)   D G Wrist (11/23/15) Displaced fractures are noted through the distal radius and ulna. Distal fragments are displaced posteriorly. Mild overlapping fracture fragments within the distal radius. IMPRESSION: Posteriorly displaced and overlapping distal radial and ulnar fractures. Electronically Signed   By: Charlett Nose M.D.   On: 11/23/2015 08:28  D G Wrist (11/24/15) FINDINGS: The patient is in a plaster splint. Position and alignment of the patient's distal radius fracture is markedly improved compared to yesterday's examination. The patient's distal ulnar fracture also demonstrates marked improvement in position and alignment. No new abnormality. IMPRESSION: Marked improvement in position and alignment of distal radius and ulnar fractures. No new abnormality. Electronically Signed   By: Drusilla Kanner M.D.   On: 11/24/2015 18:47  DG Chest: FINDINGS: Heart is mildly enlarged. No confluent airspace opacities or effusions. No acute bony abnormality. IMPRESSION: Mild cardiomegaly.  No active disease. Electronically Signed   By: Charlett Nose M.D.   On: 11/23/2015 08:18  CT Head Wo Contrast: FINDINGS: CT HEAD FINDINGS Intra cerebral hemorrhage is noted within the right frontal lobe. There likely is a small amount of subarachnoid blood overlying the right frontal lobe as well. Small subdural hemorrhages noted over the right temporal lobe measuring approximately 5 mm in thickness. No midline shift. No hydrocephalus. No acute calvarial abnormality. CT MAXILLOFACIAL FINDINGS No evidence of facial or orbital fracture. Orbital soft tissues are unremarkable. Paranasal sinuses are clear. Soft tissue swelling over the right face and orbit. CT CERVICAL SPINE FINDINGS Degenerative disc and facet disease throughout the cervical spine. Slight anterolisthesis of C4 on C5 related to facet disease. Prevertebral soft tissues are normal. No fracture. IMPRESSION:  Right frontal intra cerebral hemorrhage with overlying subarachnoid and subdural hemorrhage on the right. Thickness of subdural bleed 5 mm. No significant mass effect or midline shift. Right facial/orbital soft tissue swelling.  No underlying fracture. Cervical spondylosis.  No acute bony abnormality. Electronically Signed   By: Charlett Nose M.D.   On: 11/23/2015 09:35  Repeat CT Head Wo Contrast: FINDINGS: Parenchymal and extra-axial hemorrhage has evolved since the recent prior exam. The inferior right frontal lobe parenchymal hemorrhage/contusion is better defined. There is a mild decrease in dense hemorrhage along its lateral margin. The right-sided subdural hemorrhage has extended more posteriorly canal lying over the right parietal and temporal lobe extending along the right tentorium. A small amount of subdural hemorrhage persists along the anterior right temporal lobe. Areas of subarachnoid hemorrhage again noted above the right frontal lobe parenchymal hemorrhage. There several small areas of subarachnoid hemorrhage noted on the left, with 2 noted at the  right vertex. Hemorrhage is now seen said laying dependently in the occipital horns of the lateral ventricles. There is no evidence of new hemorrhage. There is no significant mass effect or midline shift. Ventricles are normal in size for this patient's age and normal in configuration. No hydrocephalus. IMPRESSION: 1. Parenchymal, subarachnoid and subdural hemorrhage has evolved as detailed, but there is no evidence of new intracranial hemorrhage. 2. No hydrocephalus.  No significant mass effect or midline shift. Electronically Signed   By: Amie Portland M.D.   On: 11/25/2015 14:33  Discharge Instructions: Discharge Instructions    Diet - low sodium heart healthy    Complete by:  As directed   Diet - low sodium heart healthy    Complete by:  As directed   Discharge instructions    Complete by:  As directed   It was pleasure taking care of you.Please follow up with orthopedic and your PCP as directed. Please call Dr. Ophelia Barry office for your appointment if you already have not heard any thing yet. Continue working with your therapist to regain your strength back. Continue with your breathing exercises which will help to open up your lung and decrease your cough.  Thanks, Dr. Nelson Chimes   Increase activity slowly    Complete by:  As directed   Increase activity slowly    Complete by:  As directed      Signed: Arnetha Courser, MD 11/29/2015, 11:51 AM   Pager: [phone]

## 2015-11-26 NOTE — Progress Notes (Signed)
Patient's son called nurse in patient's room and noted that patient is not acting self, she does not know where she is and talking off her head. Patient assessed and was able to answer all questions appropriately. Patient told nurse that son told her she does not know where she is. MD notified. V/S taken and recorded. Will continue to monitor.

## 2015-11-27 DIAGNOSIS — R410 Disorientation, unspecified: Secondary | ICD-10-CM

## 2015-11-27 DIAGNOSIS — L8990 Pressure ulcer of unspecified site, unspecified stage: Secondary | ICD-10-CM | POA: Insufficient documentation

## 2015-11-27 MED ORDER — DM-GUAIFENESIN ER 30-600 MG PO TB12
1.0000 | ORAL_TABLET | Freq: Two times a day (BID) | ORAL | Status: DC
  Administered 2015-11-27 – 2015-11-29 (×5): 1 via ORAL
  Filled 2015-11-27 (×5): qty 1

## 2015-11-27 NOTE — Progress Notes (Signed)
Internal Medicine Attending  Date: 11/27/2015  Patient name: Elizabeth Barry Medical record number: [id number] Date of birth: 1922/10/24 Age: 82 y.o. Gender: female  I saw and evaluated the patient. I reviewed the resident's note by Dr. Nelson Chimes and I agree with the resident's findings and plans as documented in her progress note.  [name] was alert and conversive when seen on rounds this morning. She does not remember the confusion she had yesterday. Evaluation with repeat head CT failed to show progression of her intracranial bleeding. We continue to wait for skilled nursing facility placement.

## 2015-11-27 NOTE — Progress Notes (Signed)
Subjective: C/O productive cough with dark sputum, since 1-2 days but getting worse over night. Denies any fever,chills, congestion,SOB, N/V. Night team was called yesterday with the family's concern over her change in mental status and smelling and calling people who are not there and she was more sleepy yesterday evening time. During morning rounds she denies any hallucinations. Objective:  Vital signs in last 24 hours: Vitals:   11/26/15 2121 11/27/15 0100 11/27/15 0426 11/27/15 0700  BP: (!) 137/55 (!) 130/52 (!) 146/57 (!) 136/55  Pulse: 85 88 86 81  Resp: Temp: 98.4 F (36.9 C) 98.3 F (36.8 C) 98.4 F (36.9 C) 98.5 F (36.9 C)  TempSrc: Oral Oral Oral Oral  SpO2: 99% 99% 100% 100%  Weight:      Height:       Physical Exam: General: Vital signs reviewed. Patient is elderly,in no acute distress, lying in her bed. Head: Large right sided resolving ecchymosis with hematoma over right forehead extending over right orbit to right side of mouth. Edema and ecchymosis keep improving.  Eyes:Right orbit edematous with large ecchymosis, patient is able to open and see from that eye.Laceration on side and  below right orbit. Left PERRLA with EOMI intact. Mouth: Teeth are intact. .Normal tongue.Edema and ecchymosis over right side of lip. Cardiovascular:RRR,3/6 blowing systolic murmur. Chest:Clear to auscultation bilaterally, Decreased air entry more pronounced on left side. no wheezes, rales, or rhonchi. Abdominal:Soft, non-tender, non-distended, BS +, no guarding present.  Extremities: No lower extremity edema bilaterally, pulses symmetric and intact bilaterally. Right wrist is in splint, and elevated. Neurological: A&O x4, Strength grossly intact, no focal deficit.  CT HEAD WO Contrast: COMPARISON:  11/25/2015 FINDINGS: The right inferior frontal lobe parenchymal hemorrhage is similar to the previous day's exam. There are scattered areas of subdural  and subarachnoid hemorrhage, which are also without change. Hemorrhage in the dependent lateral ventricles is less dense than on the previous day's study but otherwise staple. There is no evidence of new intracranial hemorrhage. There are no masses or significant mass effect. There is no midline shift. There is no evidence of an ischemic infarct. The ventricles are normal in size, for this patient's age, and normal in configuration. No hydrocephalus. IMPRESSION: 1. Persistent areas of parenchymal, intraventricular, subarachnoid and subdural hemorrhage without significant change from the previous day's study. No evidence of new hemorrhage. No hydrocephalus or evidence of an ischemic infarct. Electronically Signed   By: Amie Portland M.D.   On: 11/26/2015 20:59  DG Chest (Portable) FINDINGS: Cardiomegaly. There is hyperinflation of the lungs compatible with COPD. No confluent opacities or edema. No effusions. No acute bony abnormality. IMPRESSION: COPD.  Cardiomegaly.  No active disease. Electronically Signed   By: Charlett Nose M.D.   On: 11/26/2015 20:05  Assessment/Plan:  Elizabeth Barry is a 83 yo female with PMHx of HTN, CAD s/p stent placement x 2 3 years ago,and history of 2 falls over last 5 years,who presents after a fall.She was on Aspirin and Plavix.  Cerebral Hemorrhage with Subdural Hematoma and Subarachnoid Hemorrhage: Secondary to fall at home. Repeat CT yesterday was negative for any new bleed.  .  No hydrocephalus. No significant mass effect or midline shift. . Dr. Jordan Likes with Neurosurgery recommend no more images and conservative management due to her age and other comorbidies. Patient is awake, alert and oriented x 3.No evidence of neurological deficits on exam --Keep Holding Plavix and Aspirin, She might not need plavix any more  as her stent placement was done 3 years ago.  Displaced Right Radial and Ulnar Wrist Fracture: Orthopedic was able to do a closed  reduction with acceptable results on subsequent x-rays, There recommend repeat xray with splint after one week.   Delirium/Sundowning:Night team was called yesterday around 7:30Pm with the family's concern over her change in mental status and smelling and calling people who are not there and she was more sleepy yesterday evening time. They found her in chair with eyes closed, calm and in no distress, easily arousable to voice and touch. Normal vitals with just one reading of O2 sat.88% which quickly become normal.AO X1(self).Neuro exam nonfocal, with intact cranial nerves and strength/sensation grossly intact in all extremities. They did a repeat CT Head and DG chest  Which were negative for any acute change. During morning rounds she denies any hallucinations and was alert and oriented X 4. Most probably a transient delirium or sundowning effect which is common in a 82 y.o with change in their familiar environment.  Productive Cough: Her C/O worsening cough and decreased breath sound on exam. Most probably due to transient collapse of alveoli, due to her being bed bound with all those injuries. She should benefit with chest PT and Mucinex DM. Although her portable CXR done last night is negative for any infiltrate, We will repeat CXR PA and Lateral view if she develops fever or with worsening cough.  Chest Pain: Transient mid-sternal dull achey chest pain after arrival to ED which resolved. Patient was given nitroglycerin. Patient does have a history of CAD, . Incresed in trendingTroponin 0.03,0.18 0.10 wno evidence of ischemic changes on EKG. No evidence of rib fracture on CXR. Might be due to transient strain on her heart with fall. She don't have any clinical signs of ongoing ischemia.  CAD s/p Stent Placement: Patient follows with Dr. Kirtland Bouchard" in [place]. She is on plavix 75 mg daily, metoprolol 25 mg BID, atorvastatin 80 mg daily, lisinopril 10 mg daily at home. Stent placement was greater than 3  years ago per patient. -Holding plavix/ASA due to bleed -Continue atorvastatin 80 mg daily -Start her home metoprolol 25 mg BID.  History of Falls: This patient's 3rd fall in the last 3 years with prior hip fracture and shoulder injury. Patient lives by herself at home and uses lifealert. She ambulates with a walker and can complete all of her ADLs independently. Son lives 0.5 miles away. Family is concerned about her safety and they are planning if she can move with any of her 3 kids. In the past patient was hesitant and wants to stay independent. -PT/OT  HTN: Normotensive. Target BP 160/90.Her BP stay b/w 111/37 - 113/45.Patient is on lisinopril 10 mg daily, Lasix 20 mg daily, Metoprolol 25 mg BID and Hydralazine 25 mg TID at home.  -Holding for now   Depression: Patient is on citalopram 10 mg daily at home. Continue Citalopram   Dispo: Anticipated discharge in approximately 1 day(s).Waiting for bed availability at SNF.    LOS: 4 days   Arnetha Courser, MD 11/27/2015, 8:24 AM Pager: [phone]

## 2015-11-28 ENCOUNTER — Inpatient Hospital Stay (HOSPITAL_COMMUNITY): Payer: Medicare Other

## 2015-11-28 DIAGNOSIS — R05 Cough: Secondary | ICD-10-CM

## 2015-11-28 LAB — URINE MICROSCOPIC-ADD ON
RBC / HPF: NONE SEEN RBC/hpf (ref 0–5)
WBC UA: NONE SEEN WBC/hpf (ref 0–5)

## 2015-11-28 LAB — URINALYSIS, ROUTINE W REFLEX MICROSCOPIC
BILIRUBIN URINE: NEGATIVE
Glucose, UA: NEGATIVE mg/dL
Hgb urine dipstick: NEGATIVE
KETONES UR: 15 mg/dL — AB
Leukocytes, UA: NEGATIVE
NITRITE: NEGATIVE
Protein, ur: 30 mg/dL — AB
Specific Gravity, Urine: 1.022 (ref 1.005–1.030)
pH: 5.5 (ref 5.0–8.0)

## 2015-11-28 MED ORDER — KETOROLAC TROMETHAMINE 15 MG/ML IJ SOLN
15.0000 mg | Freq: Once | INTRAMUSCULAR | Status: AC | PRN
  Administered 2015-11-28: 15 mg via INTRAVENOUS
  Filled 2015-11-28: qty 1

## 2015-11-28 NOTE — Progress Notes (Signed)
Subjective: Feeling better today, still have cough, comfortable and waiting for SNF placement.  Objective:  Vital signs in last 24 hours: Vitals:   11/27/15 2100 11/28/15 0119 11/28/15 0500 11/28/15 1038  BP: (!) 132/54 (!) 116/52 122/71 (!) 133/59  Pulse: 76 72 89 91  Resp: 19 18 18 18   Temp: 98.8 F (37.1 C) 98.9 F (37.2 C) 98.5 F (36.9 C) 99.2 F (37.3 C)  TempSrc: Oral Oral Oral Oral  SpO2: 99% 99% 98% 94%  Weight:      Height:       Physical Exam: General: Vital signs reviewed. Patient is elderly,in no acute distress, lying in her bed. Head: Large right sided resolving ecchymosis with hematoma over right forehead extending over right orbit to right side of mouth. Edema and ecchymosis keepimproving. Eyes:Right orbit edematous with large ecchymosis, patient is able to open and see from that eye.Laceration on side and  below right orbit. Left PERRLA with EOMI intact. Mouth: Teeth are intact. .Normal tongue.Edema and ecchymosis over right side of lip. Cardiovascular:RRR,3/6 blowing systolic murmur. Chest:Clear to auscultation bilaterally,  no wheezes, rales, or rhonchi. Abdominal:Soft, non-tender, non-distended, BS +, no guarding present.  Extremities: No lower extremity edema bilaterally, pulses symmetric and intact bilaterally. Right wrist is in splint, and elevated. Neurological: A&O x3, Strength grossly intact, no focal deficit.   Assessment/Plan:  Elizabeth Barry is a 82 yo female with PMHx of HTN, CAD s/p stent placement x 2 3 years ago,and history of 2 falls over last 5 years,who presents after a fall.She was on Aspirin and Plavix.  Cerebral Hemorrhage with Subdural Hematoma and Subarachnoid Hemorrhage: Secondary to fall at home. Repeat CT yesterday was negative for any new bleed. . No hydrocephalus. No significant mass effect or midline shift. . Dr. Jordan Likes with Neurosurgery recommend no more images and conservative management due to her age and other  comorbidies. Patient is awake, alert and oriented x 3.No evidence of neurological deficits on exam --Keep HoldingPlavix and Aspirin, She might not need plavix any more as her stent placement was done 3 years ago.  Displaced Right Radial and Ulnar Wrist Fracture: Orthopedic was able to do a closed reduction with acceptable results on subsequent x-rays, There recommend repeat xray with splint after one week.   Delirium/Sundowning:Night team was called yesterday around 7:30Pm with the family's concern over her change in mental status and smelling and calling people who are not there and she was more sleepy yesterday evening time. They found her in chair with eyes closed, calm and in no distress, easily arousable to voice and touch. Normal vitals with just one reading of O2 sat.88% which quickly become normal.AO X1(self).Neuro exam nonfocal, with intact cranial nerves and strength/sensation grossly intact in all extremities. They did a repeat CT Head and DG chest  Which were negative for any acute change. During morning rounds she denies any hallucinations and was alert and oriented X 4. Most probably a transient delirium or sundowning effect which is common in a 82 y.o with change in their familiar environment.  Productive Cough: Her C/O worsening cough and decreased breath sound on exam. Most probably due to transient collapse of alveoli, due to her being bed bound with all those injuries. She should benefit with chest PT and Mucinex DM. Although her portable CXR done last night is negative for any infiltrate, We will repeat CXR PA and Lateral view if she develops fever or with worsening cough.  Chest Pain: Transient mid-sternal dull achey chest pain  after arrival to ED which resolved. Patient was given nitroglycerin. Patient does have a history of CAD, . Incresed in trendingTroponin 0.03,0.18 0.10 wno evidence of ischemic changes on EKG. No evidence of rib fracture on CXR. Might be due to transient  strain on her heart with fall. She don't have any clinical signs of ongoing ischemia.  CAD s/p Stent Placement: Patient follows with Dr. Kirtland Bouchard" in [place]. She is on plavix 75 mg daily, metoprolol 25 mg BID, atorvastatin 80 mg daily, lisinopril 10 mg daily at home. Stent placement was greater than 3 years ago per patient. -Holding plavix/ASA due to bleed -Continue atorvastatin 80 mg daily -Start her home metoprolol 25 mg BID.  History of Falls: This patient's 3rd fall in the last 3 years with prior hip fracture and shoulder injury. Patient lives by herself at home and uses lifealert. She ambulates with a walker and can complete all of her ADLs independently. Son lives 0.5 miles away. Family is concerned about her safety and they are planning if she can move with any of her 3 kids. In the past patient was hesitant and wants to stay independent. -PT/OT  HTN: Normotensive. Target BP 160/90.Her BP stay b/w 116/52 - 133/59.Patient is on lisinopril 10 mg daily, Lasix 20 mg daily, Metoprolol 25 mg BID and Hydralazine 25 mg TID at home.  -Holding for now   Depression: Patient is on citalopram 10 mg daily at home. Continue Citalopram   Dispo: Anticipated discharge in approximately 1 day(s).Waiting for bed availability at SNF.  .   LOS: 5 days   Arnetha Courser, MD 11/28/2015, 12:52 PM Pager: [phone]

## 2015-11-28 NOTE — Progress Notes (Signed)
Patient unavailable at this time for CPT. Will attempt again at next scheduled time.

## 2015-11-28 NOTE — Progress Notes (Signed)
Internal Medicine Attending  Date: 11/28/2015  Patient name: Elizabeth Barry Medical record number: [id number] Date of birth: [birth year] Age: 82 y.o. Gender: female  I saw and evaluated the patient. I reviewed the resident's note by Dr. Nelson Chimes and I agree with the resident's findings and plans as documented in her progress note.  We continue to wait for skilled nursing facility placement to work on short-term rehabilitation for [name] in order to improve the chances for a safer return to home.

## 2015-11-28 NOTE — Progress Notes (Signed)
Md notified that patient unable to walk today. Patient states this is r/t pain in hip. Patient also incontinent x2 throughout shift. See new orders.

## 2015-11-28 NOTE — Progress Notes (Addendum)
Paged by RN with family concerns regarding episodes of urinary and fecal incontinence (x2) that started last night as well as not moving her legs today much today due to pain. Went to bedside and spoke with patient who denies dysuria and urgency. She endorses new, sharp L sided hip pain. On physical exam, lower extremitiy motor, strength, and sensation to light touch were intact bilaterally. Patient was able to move lower extremities spontaneously without pain. Lateral thigh muscles were tender to palpation.    Ordered L hip films and in and out cath for UA. Will f/u results and consider rectal exam.   Reymundo Poll, M.D. Pager: [phone] 11/28/2015, 5:38 PM   ADDENDUM: Hip XR with no fracture. Rectal tone intact - unlikely to be a spinal process. Follow up UA  Griffin Basil, MD  Internal Medicine Teaching Service PGY-3

## 2015-11-29 DIAGNOSIS — S5290XA Unspecified fracture of unspecified forearm, initial encounter for closed fracture: Secondary | ICD-10-CM | POA: Diagnosis not present

## 2015-11-29 DIAGNOSIS — R296 Repeated falls: Secondary | ICD-10-CM | POA: Diagnosis not present

## 2015-11-29 DIAGNOSIS — S52531A Colles' fracture of right radius, initial encounter for closed fracture: Secondary | ICD-10-CM | POA: Diagnosis not present

## 2015-11-29 DIAGNOSIS — S066X0A Traumatic subarachnoid hemorrhage without loss of consciousness, initial encounter: Secondary | ICD-10-CM | POA: Diagnosis not present

## 2015-11-29 DIAGNOSIS — M439 Deforming dorsopathy, unspecified: Secondary | ICD-10-CM | POA: Diagnosis not present

## 2015-11-29 DIAGNOSIS — Z95818 Presence of other cardiac implants and grafts: Secondary | ICD-10-CM | POA: Diagnosis not present

## 2015-11-29 DIAGNOSIS — I2510 Atherosclerotic heart disease of native coronary artery without angina pectoris: Secondary | ICD-10-CM | POA: Diagnosis not present

## 2015-11-29 DIAGNOSIS — I619 Nontraumatic intracerebral hemorrhage, unspecified: Secondary | ICD-10-CM | POA: Diagnosis not present

## 2015-11-29 DIAGNOSIS — R262 Difficulty in walking, not elsewhere classified: Secondary | ICD-10-CM | POA: Diagnosis not present

## 2015-11-29 DIAGNOSIS — W19XXXA Unspecified fall, initial encounter: Secondary | ICD-10-CM | POA: Diagnosis not present

## 2015-11-29 DIAGNOSIS — R58 Hemorrhage, not elsewhere classified: Secondary | ICD-10-CM | POA: Diagnosis not present

## 2015-11-29 DIAGNOSIS — S065X0D Traumatic subdural hemorrhage without loss of consciousness, subsequent encounter: Secondary | ICD-10-CM | POA: Diagnosis not present

## 2015-11-29 DIAGNOSIS — I609 Nontraumatic subarachnoid hemorrhage, unspecified: Secondary | ICD-10-CM | POA: Diagnosis not present

## 2015-11-29 DIAGNOSIS — I6200 Nontraumatic subdural hemorrhage, unspecified: Secondary | ICD-10-CM | POA: Diagnosis not present

## 2015-11-29 DIAGNOSIS — S52531D Colles' fracture of right radius, subsequent encounter for closed fracture with routine healing: Secondary | ICD-10-CM | POA: Diagnosis not present

## 2015-11-29 DIAGNOSIS — M810 Age-related osteoporosis without current pathological fracture: Secondary | ICD-10-CM | POA: Diagnosis not present

## 2015-11-29 DIAGNOSIS — S065X0A Traumatic subdural hemorrhage without loss of consciousness, initial encounter: Secondary | ICD-10-CM | POA: Diagnosis not present

## 2015-11-29 DIAGNOSIS — I10 Essential (primary) hypertension: Secondary | ICD-10-CM | POA: Diagnosis not present

## 2015-11-29 MED ORDER — DM-GUAIFENESIN ER 30-600 MG PO TB12: 1.0000 | ORAL_TABLET | Freq: Two times a day (BID) | ORAL | 0 refills | Status: AC

## 2015-11-29 NOTE — Progress Notes (Signed)
Subjective: Feeling better, denies any pain. Was sitting in her chair eating breakfast.  Objective:  Vital signs in last 24 hours: Vitals:   11/28/15 2123 11/29/15 0207 11/29/15 0517 11/29/15 1013  BP: (!) 124/54 (!) 118/56 (!) 143/50 (!) 145/87  Pulse: 79 80 89 96  Resp: 19 19 18 18   Temp: 98.1 F (36.7 C) 98.8 F (37.1 C) 98.4 F (36.9 C) 99.7 F (37.6 C)  TempSrc: Oral Oral Oral Oral  SpO2: 97% 97% 95% 95%  Weight:      Height:       Physical Exam: General: Vital signs reviewed. Patient is elderly,in no acute distress, sitting in her chair. Head: Large right sided resolving ecchymosis over right forehead extending over right orbit to right side of mouth. Edema and ecchymosis keepimproving. Eyes:Right orbit edematous with large ecchymosis, patient is able to open and see from that eye.Laceration on side and below right orbit. Left PERRLA with EOMI intact. Mouth: Teeth are intact. .Normal tongue.Edema and ecchymosis over right side of lip. Cardiovascular:RRR,3/6 blowing systolic murmur. Chest:Clear to auscultation bilaterally, no wheezes, rales, or rhonchi. Abdominal:Soft, non-tender, non-distended, BS +, no guarding present.  Extremities: No lower extremity edema bilaterally, pulses symmetric and intact bilaterally. Right wrist is in splint, and elevated. Neurological: A&O x3, Strength grossly intact, no focal deficit.   Assessment/Plan:  Elizabeth Barry is a 82 yo female with PMHx of HTN, CAD s/p stent placement x 2 3 years ago,and history of 2 falls over last 5 years,who presents after a fall.She was on Aspirin and Plavix.  Cerebral Hemorrhage with Subdural Hematoma and Subarachnoid Hemorrhage: Secondary to fall at home. Repeat CT yesterday was negative for any new bleed. . No hydrocephalus. No significant mass effect or midline shift. . Dr. Jordan Likes with Neurosurgery recommend no more images and conservative management due to her age and other comorbidies.  Patient is awake, alert and oriented x 3.No evidence of neurological deficits on exam --Keep HoldingPlavix and Aspirin, She might not need plavix any more as her stent placement was done 3 years ago.  Displaced Right Radial and Ulnar Wrist Fracture: Orthopedic was able to do a closed reduction with acceptable results on subsequent x-rays,on 07/26. They recommend repeat xray with splint after one week.   Productive Cough: Her C/O worsening cough and decreased breath sound on exam. Most probably due to transient collapse of alveoli, due to her being bed bound with all those injuries. She should benefit with chest PT and Mucinex DM. Her cough is still there although less intense,chest is clear with normal air entry. Continue encouraging her to use her spirometer more frequently. -Continue with Mucinex DM BID.  Chest Pain: Transient mid-sternal dull achey chest pain after arrival to ED which resolved. Patient was given nitroglycerin. Patient does have a history of CAD, . Incresed in trendingTroponin 0.03,0.18 0.10 wno evidence of ischemic changes on EKG. No evidence of rib fracture on CXR. Might be due to transient strain on her heart with fall. She don't have any clinical signs of ongoing ischemia.  CAD s/p Stent Placement: Patient follows with Dr. Kirtland Bouchard" in [place]. She is on plavix 75 mg daily, metoprolol 25 mg BID, atorvastatin 80 mg daily, lisinopril 10 mg daily at home. Stent placement was greater than 3 years ago per patient. -Holding plavix/ASA due to bleed -Continue atorvastatin 80 mg daily -Start her home metoprolol 25 mg BID.  History of Falls: This patient's 3rd fall in the last 3 years with prior hip fracture and  shoulder injury. Patient lives by herself at home and uses lifealert. She ambulates with a walker and can complete all of her ADLs independently. Son lives 0.5 miles away. Family is concerned about her safety and they are planning if she can move with any of her 3 kids. In  the past patient was hesitant and wants to stay independent. -PT/OT  HTN: Normotensive. Target BP 160/90.Her BP stay b/w 118/52 - 147/87.Patient is on lisinopril 10 mg daily, Lasix 20 mg daily, Metoprolol 25 mg BID and Hydralazine 25 mg TID at home.  -Holding for now, can be restarted if she becomes hypertensive.  Depression: Patient is on citalopram 10 mg daily at home. Continue Citalopram   Dispo:Being discharged today. .   LOS: 6 days   Elizabeth Barry, Elizabeth Barry 11/29/2015, 12:07 PM Pager: [phone]

## 2015-11-29 NOTE — Progress Notes (Signed)
Internal Medicine Attending  Date: 11/29/2015  Patient name: Elizabeth Barry Medical record number: [id number] Date of birth: 01/05/1923 Age: 82 y.o. Gender: female  I saw and evaluated the patient. I reviewed the resident's note by Dr. Nelson Chimes and I agree with the resident's findings and plans as documented in her progress note.  [name] was alert and oriented 3 this morning. She states that she has had some difficulty with urine and fecal incontinence for the last 6 months and this is not new. She was without any other complaints and was ready for transfer to SNF so that she could get home sooner. She is being discharged to a skilled nursing facility today.

## 2015-11-29 NOTE — Care Management Important Message (Signed)
Important Message  Patient Details  Name: Elizabeth Barry MRN: [id number] Date of Birth: 07-05-1922   Medicare Important Message Given:  Yes    [name] 11/29/2015, 3:31 PM

## 2015-11-29 NOTE — Progress Notes (Signed)
Report called to [facility], spoke with Adelina Mings

## 2015-11-29 NOTE — Care Management Note (Signed)
Case Management Note  Patient Details  Name: Elizabeth Barry MRN: [id number] Date of Birth: Apr 12, 1923  Subjective/Objective:                    Action/Plan: Pt discharging to [facility] today. No further needs per CM.   Expected Discharge Date:                  Expected Discharge Plan:  Skilled Nursing Facility  In-House Referral:  Clinical Social Work  Discharge planning Services  CM Consult  Post Acute Care Choice:    Choice offered to:     DME Arranged:    DME Agency:     HH Arranged:    HH Agency:     Status of Service:  Completed, signed off  If discussed at [employer], dates discussed:    Additional Comments:  Kermit Balo, RN 11/29/2015, 2:00 PM

## 2015-11-29 NOTE — Progress Notes (Signed)
Patient will discharge to [facility] [place] Anticipated discharge date: 7/31 Family notified: pt dtr  Transportation by [facility]- scheduled for 2:15pm  CSW signing off.  Merlyn Lot, LCSWA Clinical Social Worker [phone]

## 2015-11-29 NOTE — Progress Notes (Signed)
Discharge orders received, Pt for discharge to [facility]. IV d/c'd. D/c instructions and RX given with verbalized understanding. F

## 2015-11-29 NOTE — Progress Notes (Signed)
Physical Therapy Treatment Patient Details Name: Mavis Gravelle MRN: [id number] DOB: 1922/06/14 Today's Date: 11/29/2015    History of Present Illness pt presents with recent fall sustaining R distal radius and ulna fxs, SDH, SAH, and cerebral hemorrhage.  pt with hx of HTN and CAD.      PT Comments    Patient required min/mod A for OOB mobility. Maintained NWB through R wrist. Pt c/o bilat hip pain with coming into sitting from supine. Current plan remains appropriate.   Follow Up Recommendations  SNF     Equipment Recommendations   (R Platform for RW if MD places order allowing weightbearing.)    Recommendations for Other Services       Precautions / Restrictions Precautions Precautions: Fall Required Braces or Orthoses: Sling Restrictions Weight Bearing Restrictions: Yes RUE Weight Bearing: Non weight bearing Other Position/Activity Restrictions: No orders or progress note indicating weightbearing status, so maintained R wrist NWBing.      Mobility  Bed Mobility Overal bed mobility: Needs Assistance Bed Mobility: Supine to Sit     Supine to sit: Mod assist     General bed mobility comments: assist to elevate trunk into sitting and scoot hips to EOB with use of bed pad; cues for sequencing  Transfers Overall transfer level: Needs assistance Equipment used: Right platform walker Transfers: Sit to/from Stand;Stand Pivot Transfers Sit to Stand: Min assist Stand pivot transfers: Mod assist       General transfer comment: assist to power up into standing and to position R UE on platform RW; assist to maintain balance upon standing with cues for posture and wider BOS; mod A needed for safe stand pivot with assist for balance and management of RW and cues for technique; pt with bilat knee buckling with each step  Ambulation/Gait                 Stairs            Wheelchair Mobility    Modified Rankin (Stroke Patients Only)       Balance Overall  balance assessment: Needs assistance   Sitting balance-Leahy Scale: Fair       Standing balance-Leahy Scale: Poor                      Cognition Arousal/Alertness: Awake/alert Behavior During Therapy: WFL for tasks assessed/performed Overall Cognitive Status: Within Functional Limits for tasks assessed                      Exercises      General Comments        Pertinent Vitals/Pain Pain Assessment: Faces Faces Pain Scale: Hurts little more Pain Location: bilat hips with transitional movement to sitting Pain Descriptors / Indicators: Grimacing;Guarding;Sharp Pain Intervention(s): Limited activity within patient's tolerance;Monitored during session;Repositioned    Home Living                      Prior Function            PT Goals (current goals can now be found in the care plan section) Acute Rehab PT Goals Patient Stated Goal: none stated PT Goal Formulation: With patient/family Time For Goal Achievement: 12/10/15 Potential to Achieve Goals: Good Progress towards PT goals: Progressing toward goals    Frequency  Min 3X/week    PT Plan Current plan remains appropriate    Co-evaluation  End of Session Equipment Utilized During Treatment: Gait belt Activity Tolerance: Patient limited by fatigue Patient left: in chair;with call bell/phone within reach;with chair alarm set     Time: 0900-0930 PT Time Calculation (min) (ACUTE ONLY): 30 min  Charges:  $Therapeutic Activity: 23-37 mins                    G Codes:      Derek Mound, PTA Pager: [phone]   11/29/2015, 10:45 AM

## 2015-11-29 NOTE — Progress Notes (Signed)
CSW was able to look up PASAR today ([id number])- pt can go to SNF when medically stable  CSW will continue to follow  Merlyn Lot, [facility] Clinical Social Worker [phone]

## 2015-11-29 NOTE — Clinical Social Work Placement (Signed)
   CLINICAL SOCIAL WORK PLACEMENT  NOTE  Date:  11/29/2015  Patient Details  Name: Elizabeth Barry MRN: [id number] Date of Birth: 19-Jun-1922  Clinical Social Work is seeking post-discharge placement for this patient at the Skilled  Nursing Facility level of care (*CSW will initial, date and re-position this form in  chart as items are completed):  Yes   Patient/family provided with [facility] Clinical Social Work Department's list of facilities offering this level of care within the geographic area requested by the patient (or if unable, by the patient's family).      Patient/family informed of their freedom to choose among providers that offer the needed level of care, that participate in Medicare, Medicaid or managed care program needed by the patient, have an available bed and are willing to accept the patient.  Yes   Patient/family informed of [facility]'s ownership interest in [facility] and [facility], as well as of the fact that they are under no obligation to receive care at these facilities.  PASRR submitted to EDS on       PASRR number received on       Existing PASRR number confirmed on 11/29/15     FL2 transmitted to all facilities in geographic area requested by pt/family on 11/26/15     FL2 transmitted to all facilities within larger geographic area on       Patient informed that his/her managed care company has contracts with or will negotiate with certain facilities, including the following:        Yes   Patient/family informed of bed offers received.  Patient chooses bed at [facility]     Physician recommends and patient chooses bed at      Patient to be transferred to [facility], [place] on 11/29/15.  Patient to be transferred to facility by ptar     Patient family notified on 11/29/15 of transfer.  Name of family member notified:  Ms. Godfrey Pick     PHYSICIAN Please sign FL2     Additional Comment:     _______________________________________________ Izora Ribas, LCSW 11/29/2015, 1:35 PM

## 2015-12-03 DIAGNOSIS — S52531D Colles' fracture of right radius, subsequent encounter for closed fracture with routine healing: Secondary | ICD-10-CM | POA: Diagnosis not present

## 2015-12-04 DIAGNOSIS — R262 Difficulty in walking, not elsewhere classified: Secondary | ICD-10-CM | POA: Diagnosis not present

## 2015-12-04 DIAGNOSIS — M439 Deforming dorsopathy, unspecified: Secondary | ICD-10-CM | POA: Diagnosis not present

## 2015-12-04 DIAGNOSIS — I2510 Atherosclerotic heart disease of native coronary artery without angina pectoris: Secondary | ICD-10-CM | POA: Diagnosis not present

## 2015-12-04 DIAGNOSIS — I6200 Nontraumatic subdural hemorrhage, unspecified: Secondary | ICD-10-CM | POA: Diagnosis not present

## 2015-12-10 DIAGNOSIS — S52531D Colles' fracture of right radius, subsequent encounter for closed fracture with routine healing: Secondary | ICD-10-CM | POA: Diagnosis not present

## 2016-01-11 DIAGNOSIS — S52531D Colles' fracture of right radius, subsequent encounter for closed fracture with routine healing: Secondary | ICD-10-CM | POA: Diagnosis not present

## 2016-01-27 DIAGNOSIS — J180 Bronchopneumonia, unspecified organism: Secondary | ICD-10-CM | POA: Diagnosis not present

## 2016-01-27 DIAGNOSIS — R05 Cough: Secondary | ICD-10-CM | POA: Diagnosis not present

## 2016-01-27 DIAGNOSIS — D649 Anemia, unspecified: Secondary | ICD-10-CM | POA: Diagnosis not present

## 2016-01-27 DIAGNOSIS — R29898 Other symptoms and signs involving the musculoskeletal system: Secondary | ICD-10-CM | POA: Diagnosis not present

## 2016-01-27 DIAGNOSIS — E785 Hyperlipidemia, unspecified: Secondary | ICD-10-CM | POA: Diagnosis not present

## 2016-01-28 DIAGNOSIS — I10 Essential (primary) hypertension: Secondary | ICD-10-CM | POA: Diagnosis not present

## 2016-01-28 DIAGNOSIS — Z79899 Other long term (current) drug therapy: Secondary | ICD-10-CM | POA: Diagnosis not present

## 2016-02-07 DIAGNOSIS — Z23 Encounter for immunization: Secondary | ICD-10-CM | POA: Diagnosis not present

## 2016-02-14 DIAGNOSIS — Z23 Encounter for immunization: Secondary | ICD-10-CM | POA: Diagnosis not present

## 2016-02-25 DIAGNOSIS — M439 Deforming dorsopathy, unspecified: Secondary | ICD-10-CM | POA: Diagnosis not present

## 2016-02-25 DIAGNOSIS — D649 Anemia, unspecified: Secondary | ICD-10-CM | POA: Diagnosis not present

## 2016-02-25 DIAGNOSIS — F339 Major depressive disorder, recurrent, unspecified: Secondary | ICD-10-CM | POA: Diagnosis not present

## 2016-02-25 DIAGNOSIS — R627 Adult failure to thrive: Secondary | ICD-10-CM | POA: Diagnosis not present

## 2016-02-25 DIAGNOSIS — R29898 Other symptoms and signs involving the musculoskeletal system: Secondary | ICD-10-CM | POA: Diagnosis not present

## 2016-02-25 DIAGNOSIS — I2510 Atherosclerotic heart disease of native coronary artery without angina pectoris: Secondary | ICD-10-CM | POA: Diagnosis not present

## 2016-02-25 DIAGNOSIS — J9611 Chronic respiratory failure with hypoxia: Secondary | ICD-10-CM | POA: Diagnosis not present

## 2016-02-25 DIAGNOSIS — M2550 Pain in unspecified joint: Secondary | ICD-10-CM | POA: Diagnosis not present

## 2016-03-15 DIAGNOSIS — D518 Other vitamin B12 deficiency anemias: Secondary | ICD-10-CM | POA: Diagnosis not present

## 2016-03-15 DIAGNOSIS — E119 Type 2 diabetes mellitus without complications: Secondary | ICD-10-CM | POA: Diagnosis not present

## 2016-03-15 DIAGNOSIS — E782 Mixed hyperlipidemia: Secondary | ICD-10-CM | POA: Diagnosis not present

## 2016-03-15 DIAGNOSIS — Z79899 Other long term (current) drug therapy: Secondary | ICD-10-CM | POA: Diagnosis not present

## 2016-03-27 DIAGNOSIS — I2510 Atherosclerotic heart disease of native coronary artery without angina pectoris: Secondary | ICD-10-CM | POA: Diagnosis not present

## 2016-03-27 DIAGNOSIS — D649 Anemia, unspecified: Secondary | ICD-10-CM | POA: Diagnosis not present

## 2016-03-27 DIAGNOSIS — R29898 Other symptoms and signs involving the musculoskeletal system: Secondary | ICD-10-CM | POA: Diagnosis not present

## 2016-03-27 DIAGNOSIS — M2550 Pain in unspecified joint: Secondary | ICD-10-CM | POA: Diagnosis not present

## 2016-03-27 DIAGNOSIS — F339 Major depressive disorder, recurrent, unspecified: Secondary | ICD-10-CM | POA: Diagnosis not present

## 2016-03-27 DIAGNOSIS — J9611 Chronic respiratory failure with hypoxia: Secondary | ICD-10-CM | POA: Diagnosis not present

## 2016-03-27 DIAGNOSIS — R627 Adult failure to thrive: Secondary | ICD-10-CM | POA: Diagnosis not present

## 2016-03-27 DIAGNOSIS — M439 Deforming dorsopathy, unspecified: Secondary | ICD-10-CM | POA: Diagnosis not present

## 2016-04-05 DIAGNOSIS — L821 Other seborrheic keratosis: Secondary | ICD-10-CM | POA: Diagnosis not present

## 2016-04-06 DIAGNOSIS — H401134 Primary open-angle glaucoma, bilateral, indeterminate stage: Secondary | ICD-10-CM | POA: Diagnosis not present

## 2016-04-06 DIAGNOSIS — H26493 Other secondary cataract, bilateral: Secondary | ICD-10-CM | POA: Diagnosis not present

## 2016-04-06 DIAGNOSIS — Z961 Presence of intraocular lens: Secondary | ICD-10-CM | POA: Diagnosis not present

## 2016-04-06 DIAGNOSIS — H524 Presbyopia: Secondary | ICD-10-CM | POA: Diagnosis not present

## 2016-04-27 DIAGNOSIS — J9611 Chronic respiratory failure with hypoxia: Secondary | ICD-10-CM | POA: Diagnosis not present

## 2016-04-27 DIAGNOSIS — R278 Other lack of coordination: Secondary | ICD-10-CM | POA: Diagnosis not present

## 2016-04-27 DIAGNOSIS — I119 Hypertensive heart disease without heart failure: Secondary | ICD-10-CM | POA: Diagnosis not present

## 2016-04-27 DIAGNOSIS — R627 Adult failure to thrive: Secondary | ICD-10-CM | POA: Diagnosis not present

## 2016-04-27 DIAGNOSIS — D649 Anemia, unspecified: Secondary | ICD-10-CM | POA: Diagnosis not present

## 2016-04-27 DIAGNOSIS — M2550 Pain in unspecified joint: Secondary | ICD-10-CM | POA: Diagnosis not present

## 2016-04-27 DIAGNOSIS — M439 Deforming dorsopathy, unspecified: Secondary | ICD-10-CM | POA: Diagnosis not present

## 2016-04-27 DIAGNOSIS — E785 Hyperlipidemia, unspecified: Secondary | ICD-10-CM | POA: Diagnosis not present

## 2016-04-27 DIAGNOSIS — I2510 Atherosclerotic heart disease of native coronary artery without angina pectoris: Secondary | ICD-10-CM | POA: Diagnosis not present

## 2016-04-27 DIAGNOSIS — I639 Cerebral infarction, unspecified: Secondary | ICD-10-CM | POA: Diagnosis not present

## 2016-04-27 DIAGNOSIS — M6281 Muscle weakness (generalized): Secondary | ICD-10-CM | POA: Diagnosis not present

## 2016-04-28 DIAGNOSIS — M6281 Muscle weakness (generalized): Secondary | ICD-10-CM | POA: Diagnosis not present

## 2016-04-28 DIAGNOSIS — I639 Cerebral infarction, unspecified: Secondary | ICD-10-CM | POA: Diagnosis not present

## 2016-04-28 DIAGNOSIS — R278 Other lack of coordination: Secondary | ICD-10-CM | POA: Diagnosis not present

## 2016-04-29 DIAGNOSIS — I639 Cerebral infarction, unspecified: Secondary | ICD-10-CM | POA: Diagnosis not present

## 2016-04-29 DIAGNOSIS — M6281 Muscle weakness (generalized): Secondary | ICD-10-CM | POA: Diagnosis not present

## 2016-04-29 DIAGNOSIS — R278 Other lack of coordination: Secondary | ICD-10-CM | POA: Diagnosis not present

## 2016-05-02 DIAGNOSIS — R278 Other lack of coordination: Secondary | ICD-10-CM | POA: Diagnosis not present

## 2016-05-02 DIAGNOSIS — M6281 Muscle weakness (generalized): Secondary | ICD-10-CM | POA: Diagnosis not present

## 2016-05-02 DIAGNOSIS — I639 Cerebral infarction, unspecified: Secondary | ICD-10-CM | POA: Diagnosis not present

## 2016-05-03 DIAGNOSIS — M6281 Muscle weakness (generalized): Secondary | ICD-10-CM | POA: Diagnosis not present

## 2016-05-03 DIAGNOSIS — R278 Other lack of coordination: Secondary | ICD-10-CM | POA: Diagnosis not present

## 2016-05-03 DIAGNOSIS — I639 Cerebral infarction, unspecified: Secondary | ICD-10-CM | POA: Diagnosis not present

## 2016-05-04 DIAGNOSIS — M6281 Muscle weakness (generalized): Secondary | ICD-10-CM | POA: Diagnosis not present

## 2016-05-04 DIAGNOSIS — I639 Cerebral infarction, unspecified: Secondary | ICD-10-CM | POA: Diagnosis not present

## 2016-05-04 DIAGNOSIS — R278 Other lack of coordination: Secondary | ICD-10-CM | POA: Diagnosis not present

## 2016-05-05 DIAGNOSIS — M6281 Muscle weakness (generalized): Secondary | ICD-10-CM | POA: Diagnosis not present

## 2016-05-05 DIAGNOSIS — I639 Cerebral infarction, unspecified: Secondary | ICD-10-CM | POA: Diagnosis not present

## 2016-05-05 DIAGNOSIS — R278 Other lack of coordination: Secondary | ICD-10-CM | POA: Diagnosis not present

## 2016-05-08 DIAGNOSIS — R278 Other lack of coordination: Secondary | ICD-10-CM | POA: Diagnosis not present

## 2016-05-08 DIAGNOSIS — I639 Cerebral infarction, unspecified: Secondary | ICD-10-CM | POA: Diagnosis not present

## 2016-05-08 DIAGNOSIS — M6281 Muscle weakness (generalized): Secondary | ICD-10-CM | POA: Diagnosis not present

## 2016-05-09 DIAGNOSIS — R278 Other lack of coordination: Secondary | ICD-10-CM | POA: Diagnosis not present

## 2016-05-09 DIAGNOSIS — M6281 Muscle weakness (generalized): Secondary | ICD-10-CM | POA: Diagnosis not present

## 2016-05-09 DIAGNOSIS — I639 Cerebral infarction, unspecified: Secondary | ICD-10-CM | POA: Diagnosis not present

## 2016-05-10 DIAGNOSIS — R278 Other lack of coordination: Secondary | ICD-10-CM | POA: Diagnosis not present

## 2016-05-10 DIAGNOSIS — I639 Cerebral infarction, unspecified: Secondary | ICD-10-CM | POA: Diagnosis not present

## 2016-05-10 DIAGNOSIS — M6281 Muscle weakness (generalized): Secondary | ICD-10-CM | POA: Diagnosis not present

## 2016-05-11 DIAGNOSIS — M6281 Muscle weakness (generalized): Secondary | ICD-10-CM | POA: Diagnosis not present

## 2016-05-11 DIAGNOSIS — R278 Other lack of coordination: Secondary | ICD-10-CM | POA: Diagnosis not present

## 2016-05-11 DIAGNOSIS — I639 Cerebral infarction, unspecified: Secondary | ICD-10-CM | POA: Diagnosis not present

## 2016-05-12 DIAGNOSIS — M6281 Muscle weakness (generalized): Secondary | ICD-10-CM | POA: Diagnosis not present

## 2016-05-12 DIAGNOSIS — R278 Other lack of coordination: Secondary | ICD-10-CM | POA: Diagnosis not present

## 2016-05-12 DIAGNOSIS — I639 Cerebral infarction, unspecified: Secondary | ICD-10-CM | POA: Diagnosis not present

## 2016-05-15 DIAGNOSIS — M6281 Muscle weakness (generalized): Secondary | ICD-10-CM | POA: Diagnosis not present

## 2016-05-15 DIAGNOSIS — I639 Cerebral infarction, unspecified: Secondary | ICD-10-CM | POA: Diagnosis not present

## 2016-05-15 DIAGNOSIS — R278 Other lack of coordination: Secondary | ICD-10-CM | POA: Diagnosis not present

## 2016-05-16 DIAGNOSIS — I639 Cerebral infarction, unspecified: Secondary | ICD-10-CM | POA: Diagnosis not present

## 2016-05-16 DIAGNOSIS — R278 Other lack of coordination: Secondary | ICD-10-CM | POA: Diagnosis not present

## 2016-05-16 DIAGNOSIS — M6281 Muscle weakness (generalized): Secondary | ICD-10-CM | POA: Diagnosis not present

## 2016-05-24 DIAGNOSIS — M79675 Pain in left toe(s): Secondary | ICD-10-CM | POA: Diagnosis not present

## 2016-05-24 DIAGNOSIS — B351 Tinea unguium: Secondary | ICD-10-CM | POA: Diagnosis not present

## 2016-05-24 DIAGNOSIS — M79674 Pain in right toe(s): Secondary | ICD-10-CM | POA: Diagnosis not present

## 2016-05-28 DIAGNOSIS — E785 Hyperlipidemia, unspecified: Secondary | ICD-10-CM | POA: Diagnosis not present

## 2016-05-28 DIAGNOSIS — M439 Deforming dorsopathy, unspecified: Secondary | ICD-10-CM | POA: Diagnosis not present

## 2016-05-28 DIAGNOSIS — I2510 Atherosclerotic heart disease of native coronary artery without angina pectoris: Secondary | ICD-10-CM | POA: Diagnosis not present

## 2016-05-28 DIAGNOSIS — R627 Adult failure to thrive: Secondary | ICD-10-CM | POA: Diagnosis not present

## 2016-05-28 DIAGNOSIS — M2550 Pain in unspecified joint: Secondary | ICD-10-CM | POA: Diagnosis not present

## 2016-05-28 DIAGNOSIS — D649 Anemia, unspecified: Secondary | ICD-10-CM | POA: Diagnosis not present

## 2016-05-28 DIAGNOSIS — I119 Hypertensive heart disease without heart failure: Secondary | ICD-10-CM | POA: Diagnosis not present

## 2016-05-28 DIAGNOSIS — J9611 Chronic respiratory failure with hypoxia: Secondary | ICD-10-CM | POA: Diagnosis not present

## 2016-05-31 DIAGNOSIS — F320 Major depressive disorder, single episode, mild: Secondary | ICD-10-CM | POA: Diagnosis not present

## 2016-05-31 DIAGNOSIS — Z7689 Persons encountering health services in other specified circumstances: Secondary | ICD-10-CM | POA: Diagnosis not present

## 2016-06-21 DIAGNOSIS — F320 Major depressive disorder, single episode, mild: Secondary | ICD-10-CM | POA: Diagnosis not present

## 2016-07-04 DIAGNOSIS — L918 Other hypertrophic disorders of the skin: Secondary | ICD-10-CM | POA: Diagnosis not present

## 2016-09-06 DIAGNOSIS — H401113 Primary open-angle glaucoma, right eye, severe stage: Secondary | ICD-10-CM | POA: Diagnosis not present

## 2016-09-09 DIAGNOSIS — J181 Lobar pneumonia, unspecified organism: Secondary | ICD-10-CM | POA: Diagnosis not present

## 2016-09-09 DIAGNOSIS — I6789 Other cerebrovascular disease: Secondary | ICD-10-CM | POA: Diagnosis not present

## 2016-09-09 DIAGNOSIS — R0902 Hypoxemia: Secondary | ICD-10-CM | POA: Diagnosis not present

## 2016-09-09 DIAGNOSIS — Z9981 Dependence on supplemental oxygen: Secondary | ICD-10-CM | POA: Diagnosis not present

## 2016-09-09 DIAGNOSIS — R251 Tremor, unspecified: Secondary | ICD-10-CM | POA: Diagnosis not present

## 2016-09-09 DIAGNOSIS — R509 Fever, unspecified: Secondary | ICD-10-CM | POA: Diagnosis not present

## 2016-09-12 DIAGNOSIS — F320 Major depressive disorder, single episode, mild: Secondary | ICD-10-CM | POA: Diagnosis not present

## 2016-09-12 DIAGNOSIS — I10 Essential (primary) hypertension: Secondary | ICD-10-CM | POA: Diagnosis not present

## 2016-09-12 DIAGNOSIS — J439 Emphysema, unspecified: Secondary | ICD-10-CM | POA: Diagnosis not present

## 2016-09-12 DIAGNOSIS — R1312 Dysphagia, oropharyngeal phase: Secondary | ICD-10-CM | POA: Diagnosis not present

## 2016-09-12 DIAGNOSIS — I5032 Chronic diastolic (congestive) heart failure: Secondary | ICD-10-CM | POA: Diagnosis not present

## 2016-09-12 DIAGNOSIS — A419 Sepsis, unspecified organism: Secondary | ICD-10-CM | POA: Diagnosis not present

## 2016-09-12 DIAGNOSIS — F419 Anxiety disorder, unspecified: Secondary | ICD-10-CM | POA: Diagnosis not present

## 2016-09-12 DIAGNOSIS — J189 Pneumonia, unspecified organism: Secondary | ICD-10-CM | POA: Diagnosis not present

## 2016-09-12 DIAGNOSIS — R262 Difficulty in walking, not elsewhere classified: Secondary | ICD-10-CM | POA: Diagnosis not present

## 2016-09-12 DIAGNOSIS — K219 Gastro-esophageal reflux disease without esophagitis: Secondary | ICD-10-CM | POA: Diagnosis not present

## 2016-09-12 DIAGNOSIS — J449 Chronic obstructive pulmonary disease, unspecified: Secondary | ICD-10-CM | POA: Diagnosis not present

## 2016-09-12 DIAGNOSIS — R2681 Unsteadiness on feet: Secondary | ICD-10-CM | POA: Diagnosis not present

## 2016-09-12 DIAGNOSIS — H409 Unspecified glaucoma: Secondary | ICD-10-CM | POA: Diagnosis not present

## 2016-09-12 DIAGNOSIS — R6883 Chills (without fever): Secondary | ICD-10-CM | POA: Diagnosis not present

## 2016-09-12 DIAGNOSIS — R296 Repeated falls: Secondary | ICD-10-CM | POA: Diagnosis not present

## 2016-09-12 DIAGNOSIS — N179 Acute kidney failure, unspecified: Secondary | ICD-10-CM | POA: Diagnosis not present

## 2016-09-12 DIAGNOSIS — J9611 Chronic respiratory failure with hypoxia: Secondary | ICD-10-CM | POA: Diagnosis not present

## 2016-09-12 DIAGNOSIS — E7800 Pure hypercholesterolemia, unspecified: Secondary | ICD-10-CM | POA: Diagnosis not present

## 2016-09-12 DIAGNOSIS — I252 Old myocardial infarction: Secondary | ICD-10-CM | POA: Diagnosis not present

## 2016-09-12 DIAGNOSIS — I4891 Unspecified atrial fibrillation: Secondary | ICD-10-CM | POA: Diagnosis not present

## 2016-09-12 DIAGNOSIS — Z85038 Personal history of other malignant neoplasm of large intestine: Secondary | ICD-10-CM | POA: Diagnosis not present

## 2016-09-12 DIAGNOSIS — M1990 Unspecified osteoarthritis, unspecified site: Secondary | ICD-10-CM | POA: Diagnosis not present

## 2016-10-09 DIAGNOSIS — R05 Cough: Secondary | ICD-10-CM | POA: Diagnosis not present

## 2016-10-16 DIAGNOSIS — H401134 Primary open-angle glaucoma, bilateral, indeterminate stage: Secondary | ICD-10-CM | POA: Diagnosis not present

## 2016-10-16 DIAGNOSIS — Z961 Presence of intraocular lens: Secondary | ICD-10-CM | POA: Diagnosis not present

## 2017-01-03 DIAGNOSIS — H26491 Other secondary cataract, right eye: Secondary | ICD-10-CM | POA: Diagnosis not present

## 2017-01-03 DIAGNOSIS — H401132 Primary open-angle glaucoma, bilateral, moderate stage: Secondary | ICD-10-CM | POA: Diagnosis not present

## 2017-03-27 DIAGNOSIS — H401134 Primary open-angle glaucoma, bilateral, indeterminate stage: Secondary | ICD-10-CM | POA: Diagnosis not present

## 2017-03-27 DIAGNOSIS — Z961 Presence of intraocular lens: Secondary | ICD-10-CM | POA: Diagnosis not present

## 2017-03-27 DIAGNOSIS — H26493 Other secondary cataract, bilateral: Secondary | ICD-10-CM | POA: Diagnosis not present

## 2017-06-25 IMAGING — CT CT HEAD W/O CM
4 series · 16 of 47 positions shown, 18 images · non-contrast
Comparison: 11/25/2015

CLINICAL DATA: Patient's family called nurse in room at 8800 and
voiced concern about patient's mentation. Assessed and noted that
patient is more disoriented than earlier. Not able to tell where she
is and falling asleep easily. TOTTIO notified and came to assess
patient. Oncoming nurse made aware of condition and in room when TOTTIO
arrived to assess patient.

EXAM:
CT HEAD WITHOUT CONTRAST
TECHNIQUE: Contiguous axial images were obtained from the base of the skull
through the vertex without intravenous contrast.

[Series 2: head without · axial · non-contrast · 0.40mm/px · z∈[-92,+28]mm · 7 of 33 slices shown, 9 images]
[im 5/33  brain]
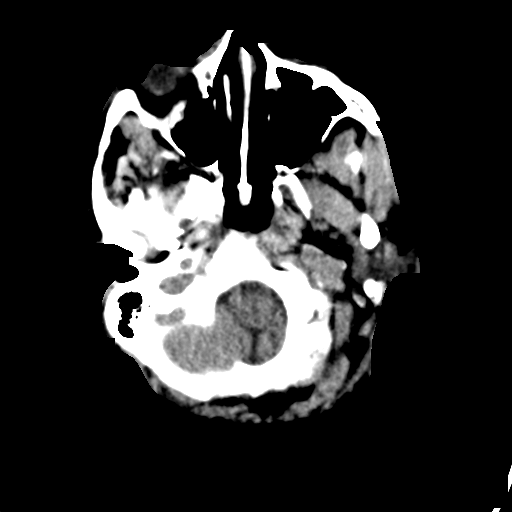
[im 5/33  bone]
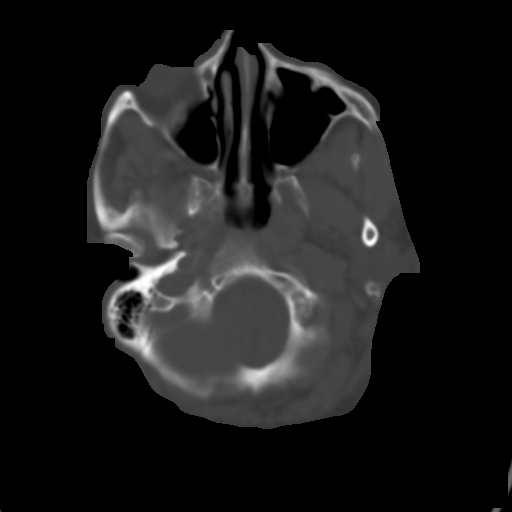
[im 9/33  brain]
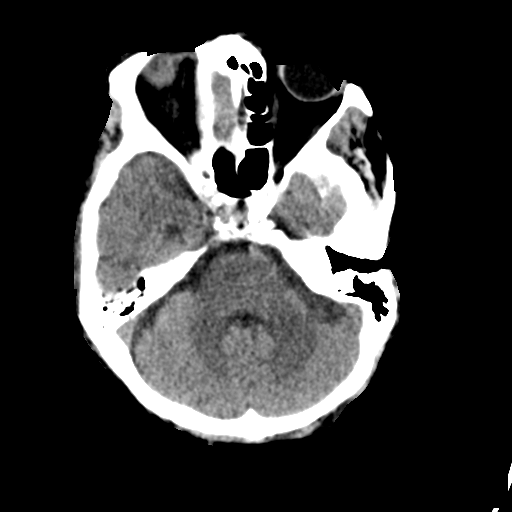
[im 13/33  brain]
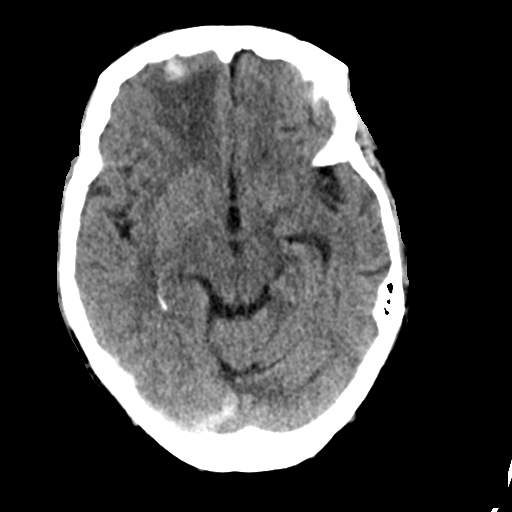
[im 17/33  brain]
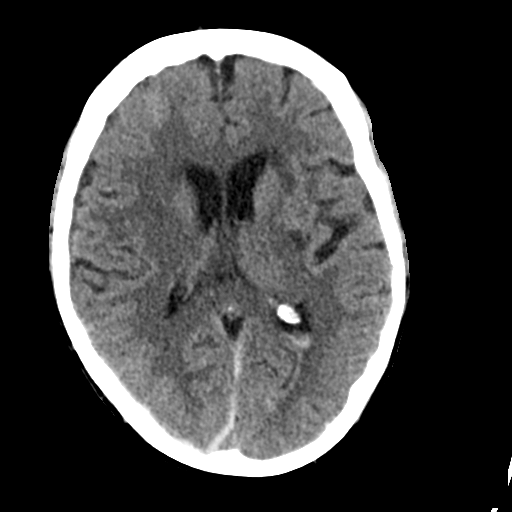
[im 21/33  brain]
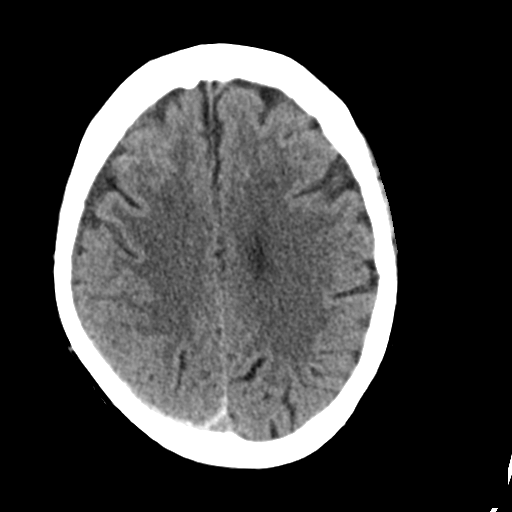
[im 21/33  bone]
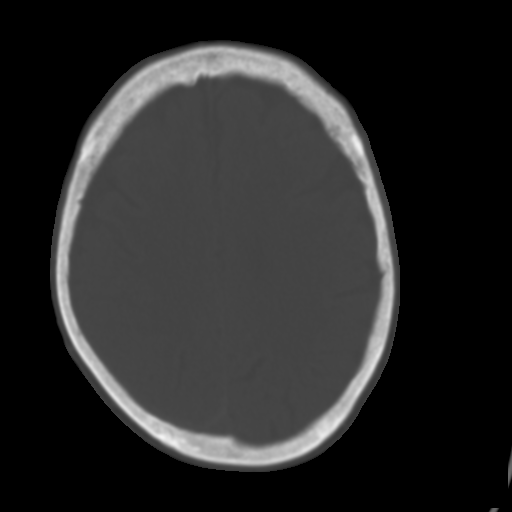
[im 25/33  brain]
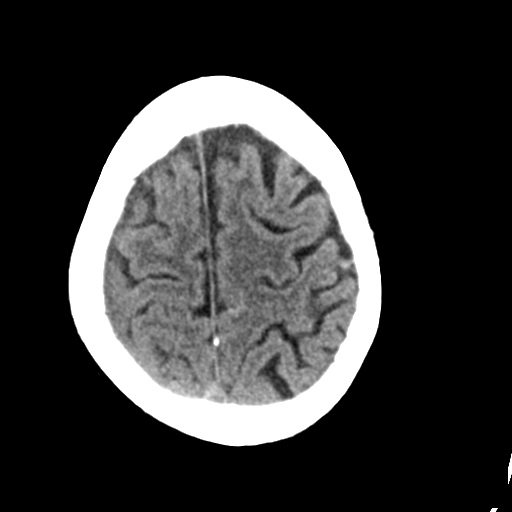
[im 29/33  brain]
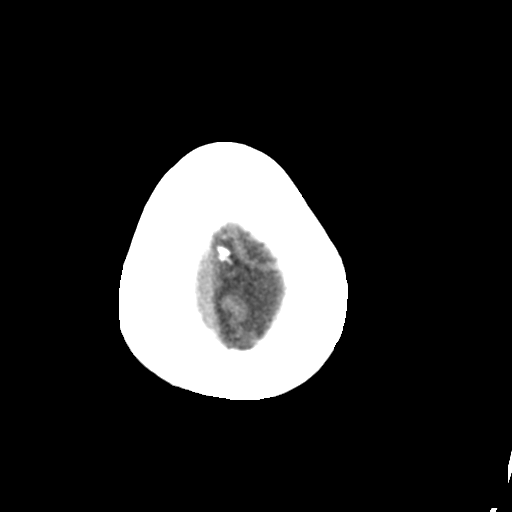

[Series 3: head bone · axial · 0.40mm/px · z∈[-96,-64]mm · 3 of 81 slices shown]
[im 9/81  bone]
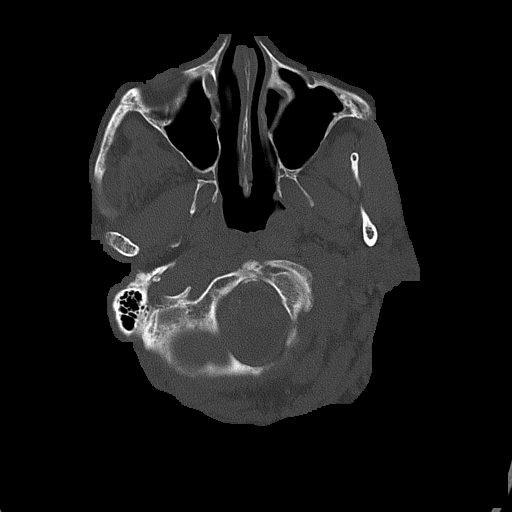
[im 17/81  bone]
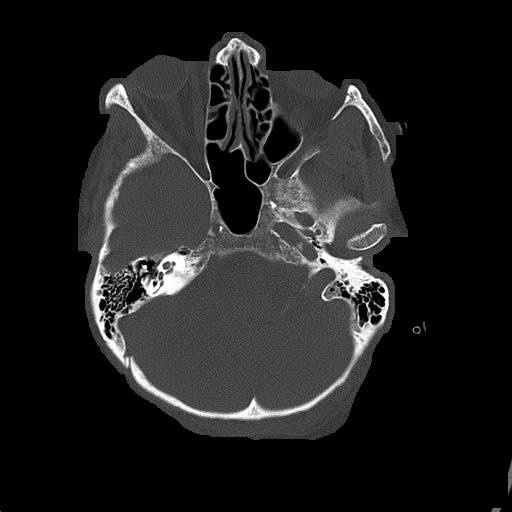
[im 25/81  bone]
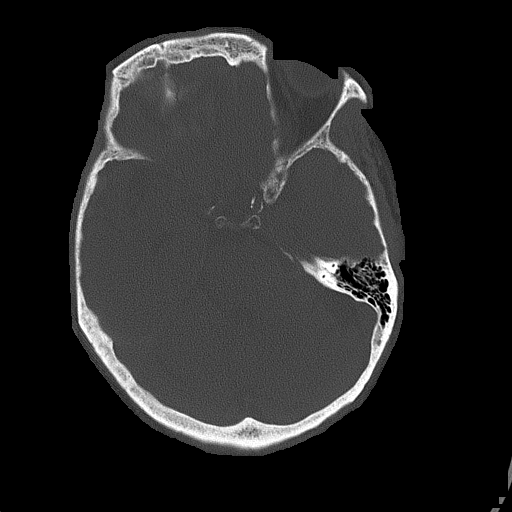

[Series 4: head without cor · coronal · non-contrast · 0.31mm/px · 3 of 67 slices shown]
[im 23/67  brain]
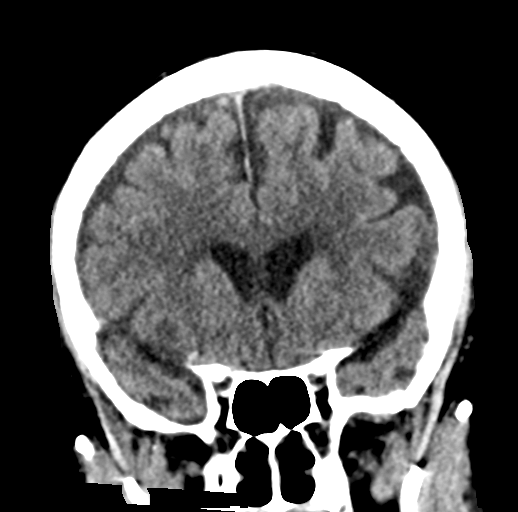
[im 30/67  brain]
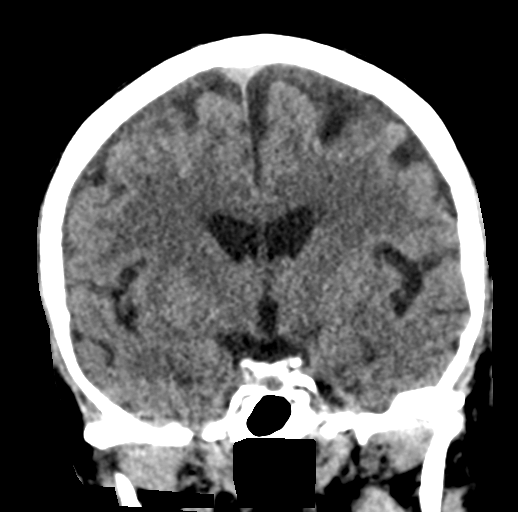
[im 37/67  brain]
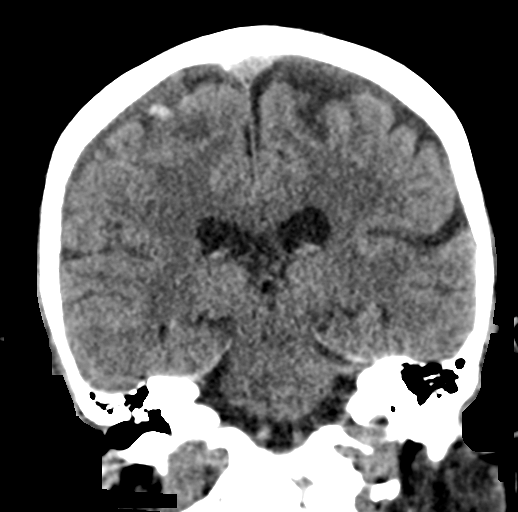

[Series 5: head without sag · sagittal · non-contrast · 0.27mm/px · 3 of 54 slices shown]
[im 18/54  brain]
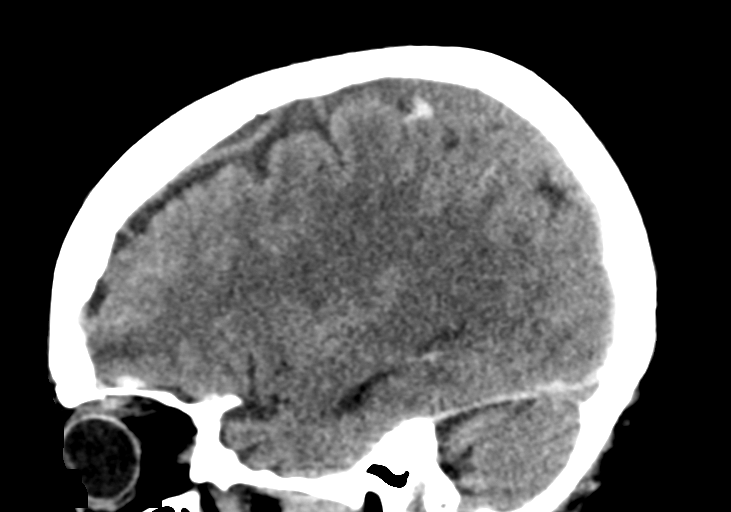
[im 27/54  brain]
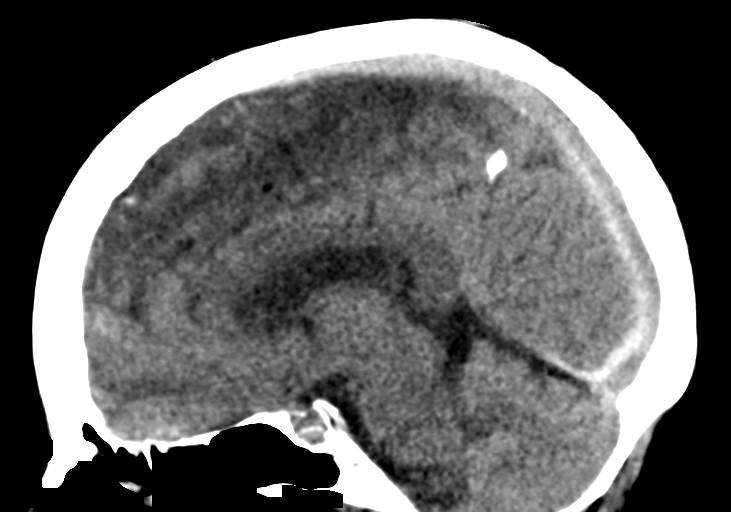
[im 36/54  brain]
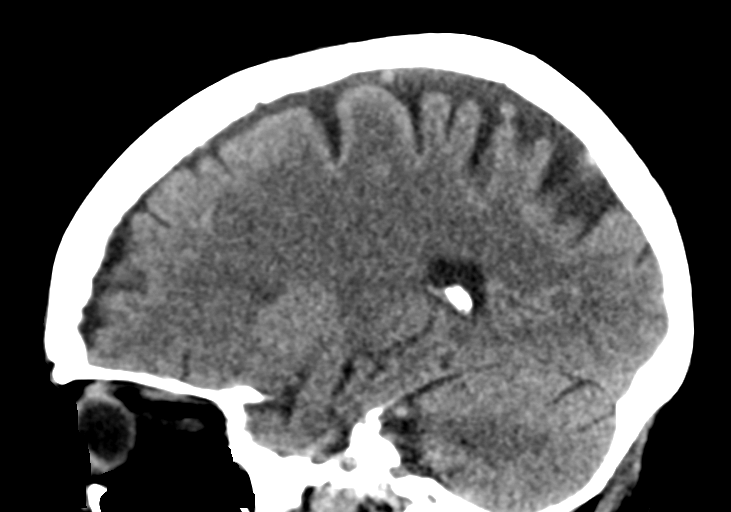

[16 of 47 positions shown; findings below may reference images not displayed]

FINDINGS: The right inferior frontal lobe parenchymal hemorrhage is similar to
the previous day's exam. There are scattered areas of subdural and
subarachnoid hemorrhage, which are also without change. Hemorrhage
in the dependent lateral ventricles is less dense than on the
previous day's study but otherwise staple. There is no evidence of
new intracranial hemorrhage.

There are no masses or significant mass effect. There is no midline
shift.

There is no evidence of an ischemic infarct.

The ventricles are normal in size, for this patient's age, and
normal in configuration. No hydrocephalus.
IMPRESSION: 1. Persistent areas of parenchymal, intraventricular, subarachnoid
and subdural hemorrhage without significant change from the previous
day's study. No evidence of new hemorrhage. No hydrocephalus or
evidence of an ischemic infarct.

## 2017-06-25 IMAGING — CR DG CHEST 1V PORT
1 series · 1 of 1 positions shown · non-contrast
Comparison: 11/23/2015

CLINICAL DATA: Hypoxia, congestion

EXAM:
PORTABLE CHEST 1 VIEW

[AP]
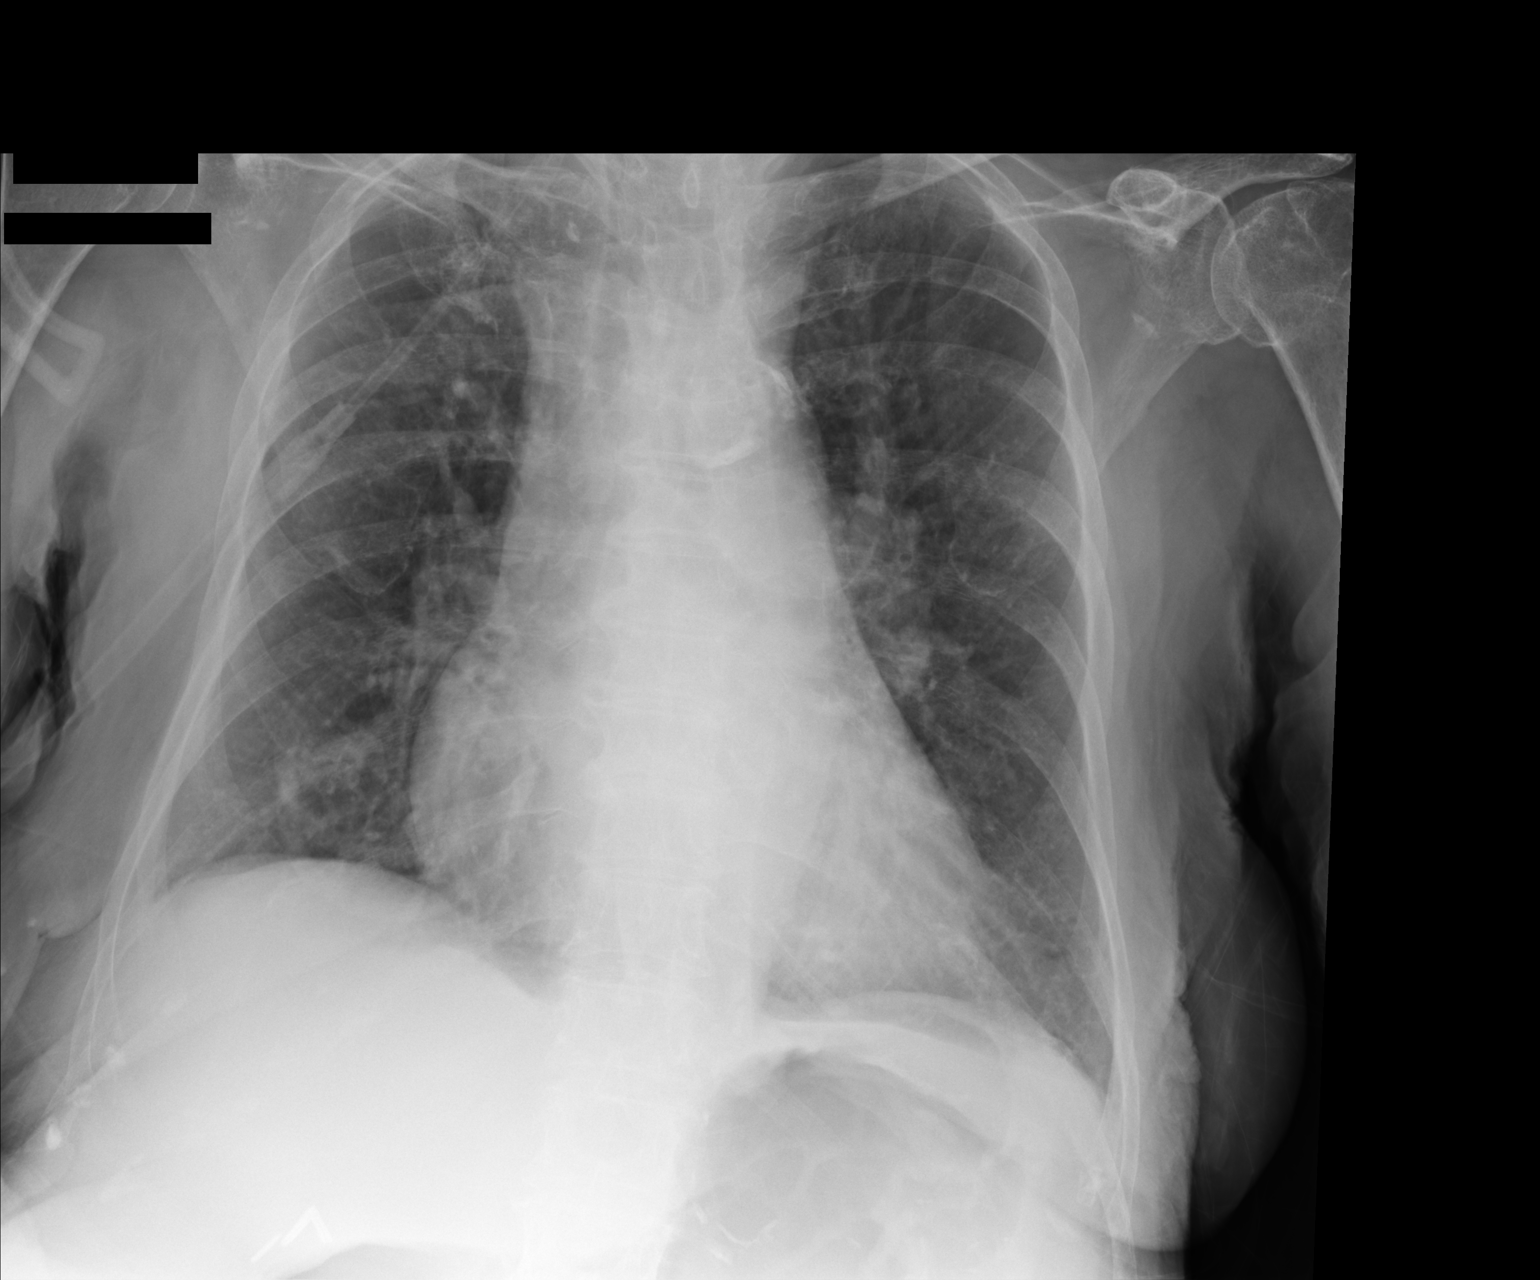

[1 of 1 positions shown; findings below may reference images not displayed]

FINDINGS: Cardiomegaly. There is hyperinflation of the lungs compatible with
COPD. No confluent opacities or edema. No effusions. No acute bony
abnormality.
IMPRESSION: COPD.  Cardiomegaly.  No active disease.

## 2017-10-29 ENCOUNTER — Ambulatory Visit: Payer: PRIVATE HEALTH INSURANCE | Admitting: Cardiology
